# Patient Record
Sex: Female | Born: 1965 | Race: White | Hispanic: No | Smoking: Former smoker
Health system: Western US, Community
[De-identification: ages and names within clinical notes are randomized; demographics above are authoritative.]

## PROBLEM LIST (undated history)

## (undated) DIAGNOSIS — R490 Dysphonia: Secondary | ICD-10-CM

## (undated) HISTORY — PX: WISDOM TOOTH EXTRACTION: SHX21

---

## 2007-04-08 ENCOUNTER — Emergency Department: Payer: Self-pay | Admitting: Internal Medicine

## 2007-09-02 ENCOUNTER — Ambulatory Visit: Payer: Self-pay | Admitting: Gastroenterology

## 2009-02-13 LAB — CBC WITH AUTO DIFFERENTIAL
Basophils %: 1 % (ref 0–2)
Basophils, Absolute: 0.1 10*3/uL (ref 0–0.2)
Eosinophils %: 1 % (ref 0–7)
Eosinophils, Absolute: 0.1 10*3/uL (ref 0–0.7)
HCT: 43.3 % (ref 37.0–48.0)
Hgb: 14.8 gm/dL (ref 12.0–16.0)
Lymphocytes %: 32 % (ref 25–45)
Lymphocytes, Absolute: 3 10*3/uL (ref 1.1–4.3)
MCH: 31.5 pg (ref 27–34)
MCHC: 34.1 gm/dL (ref 32–36)
MCV: 92.5 fL (ref 81–99)
MPV: 7.1 fL — ABNORMAL LOW (ref 7.4–10.4)
Monocytes %: 6 % (ref 0–12)
Monocytes, Absolute: 0.6 10*3/uL (ref 0–1.2)
Neutrophils, Absolute: 5.7 10*3/uL (ref 1.6–7.3)
Platelet Count: 356 10*3/uL (ref 150–400)
RBC: 4.68 10*6/uL (ref 4.20–5.40)
RDW: 13.3 % (ref 11.5–14.5)
Segs (Neutrophils)%: 60 % (ref 35–70)
WBC: 9.4 10*3/uL (ref 4.8–10.8)

## 2009-02-13 LAB — COMPREHENSIVE METABOLIC PANEL
ALT - Alanine Amino transferase: 15 IU/L (ref 5–40)
AST - Aspartate Aminotransferase: 18 IU/L (ref 10–42)
Albumin/Globulin Ratio: 1.4 (ref >0.9)
Albumin: 4 gm/dL (ref 3.5–5.0)
Alkaline Phosphatase: 81 IU/L (ref 37–107)
Anion Gap: 8 mmol/L (ref 3–11)
BUN: 7 mg/dL — ABNORMAL LOW (ref 8–21)
Bilirubin, Total: 0.7 mg/dL (ref 0.2–1.2)
CO2 - Carbon Dioxide: 28 mmol/L (ref 22–31)
Calcium: 9.1 mg/dL (ref 8.5–10.5)
Chloride: 104 mmol/L (ref 98–111)
Creatinine, Serum: 0.9 mg/dL (ref 0.6–1.3)
GFR Estimate: >60 mL/min/{1.73_m2} (ref >60)
Globulin: 2.9 gm/dL (ref 2.2–3.7)
Glucose: 94 mg/dL (ref 80–99)
Potassium: 3.5 mmol/L (ref 3.5–5.1)
Protein, Total: 6.9 gm/dL (ref 6.1–7.9)
Sodium: 140 mmol/L (ref 133–147)

## 2009-02-13 LAB — URINALYSIS, ROUTINE
Bilirubin, urine: NEGATIVE
Glucose, UA: NEGATIVE mg/dl
Leukocyte Esterase, urine: NEGATIVE
Nitrite, urine: NEGATIVE
Protein, urine: NEGATIVE
Specific Gravity, urine: >1.03 (ref 1.002–1.035)
Urobilinogen, urine: NEGATIVE EU/dL
pH, UA: 6 (ref 5.0–9.0)

## 2009-02-13 LAB — UA, MICROSCOPIC ONLY
RBC, UA: 2 /HPF — AB
Squamous Epithelial, UA: 2 /HPF (ref 0–5)
WBC, UA: 0 /HPF

## 2009-02-13 LAB — LIPASE: Lipase: 11 U/L — ABNORMAL LOW (ref 15–50)

## 2009-02-18 LAB — COMPREHENSIVE METABOLIC PANEL
ALT - Alanine Amino transferase: 12 IU/L (ref 5–40)
AST - Aspartate Aminotransferase: 18 IU/L (ref 10–42)
Albumin/Globulin Ratio: 1.3 (ref >0.9)
Albumin: 3.2 gm/dL — ABNORMAL LOW (ref 3.5–5.0)
Alkaline Phosphatase: 59 IU/L (ref 37–107)
Anion Gap: 7 mmol/L (ref 3–11)
BUN: 4 mg/dL — CL (ref 8–21)
Bilirubin, Total: 0.5 mg/dL (ref 0.2–1.2)
CO2 - Carbon Dioxide: 25 mmol/L (ref 22–31)
Calcium: 8 mg/dL — ABNORMAL LOW (ref 8.5–10.5)
Chloride: 107 mmol/L (ref 98–111)
Creatinine, Serum: 0.7 mg/dL (ref 0.6–1.3)
GFR Estimate: >60 mL/min/{1.73_m2} (ref >60)
Globulin: 2.4 gm/dL (ref 2.2–3.7)
Glucose: 104 mg/dL — ABNORMAL HIGH (ref 80–99)
Potassium: 3.6 mmol/L (ref 3.5–5.1)
Protein, Total: 5.6 gm/dL — ABNORMAL LOW (ref 6.1–7.9)
Sodium: 139 mmol/L (ref 133–147)

## 2009-02-18 LAB — CBC WITH AUTO DIFFERENTIAL
Basophils %: 1 % (ref 0–2)
Basophils, Absolute: 0 10*3/uL (ref 0–0.2)
Eosinophils %: 4 % (ref 0–7)
Eosinophils, Absolute: 0.3 10*3/uL (ref 0–0.7)
HCT: 35.1 % — ABNORMAL LOW (ref 37.0–48.0)
Hgb: 12 gm/dL (ref 12.0–16.0)
Lymphocytes %: 42 % (ref 25–45)
Lymphocytes, Absolute: 3.4 10*3/uL (ref 1.1–4.3)
MCH: 31.9 pg (ref 27–34)
MCHC: 34.2 gm/dL (ref 32–36)
MCV: 93.3 fL (ref 81–99)
MPV: 6.9 fL — ABNORMAL LOW (ref 7.4–10.4)
Monocytes %: 8 % (ref 0–12)
Monocytes, Absolute: 0.6 10*3/uL (ref 0–1.2)
Neutrophils, Absolute: 3.7 10*3/uL (ref 1.6–7.3)
Platelet Count: 291 10*3/uL (ref 150–400)
RBC: 3.76 10*6/uL — ABNORMAL LOW (ref 4.20–5.40)
RDW: 13.9 % (ref 11.5–14.5)
Segs (Neutrophils)%: 45 % (ref 35–70)
WBC: 8 10*3/uL (ref 4.8–10.8)

## 2009-05-02 LAB — COMPREHENSIVE METABOLIC PANEL
ALT - Alanine Amino transferase: 13 IU/L (ref 5–40)
AST - Aspartate Aminotransferase: 17 IU/L (ref 10–42)
Albumin/Globulin Ratio: 1.4 (ref >0.9)
Albumin: 3.8 gm/dL (ref 3.5–5.0)
Alkaline Phosphatase: 77 IU/L (ref 37–107)
Anion Gap: 7 mmol/L (ref 3–11)
BUN: 7 mg/dL — ABNORMAL LOW (ref 8–21)
Bilirubin, Total: 0.5 mg/dL (ref 0.2–1.2)
CO2 - Carbon Dioxide: 26 mmol/L (ref 22–31)
Calcium: 8.7 mg/dL (ref 8.5–10.5)
Chloride: 110 mmol/L (ref 98–111)
Creatinine, Serum: 0.82 mg/dL (ref 0.44–1.03)
GFR Estimate: >60 mL/min/{1.73_m2} (ref >60)
Globulin: 2.7 gm/dL (ref 2.2–3.7)
Glucose: 96 mg/dL (ref 80–99)
Potassium: 3.6 mmol/L (ref 3.5–5.1)
Protein, Total: 6.5 gm/dL (ref 6.1–7.9)
Sodium: 143 mmol/L (ref 133–147)

## 2009-05-02 LAB — CBC WITH AUTO DIFFERENTIAL
Basophils %: 0 % (ref 0–2)
Basophils, Absolute: 0 10*3/uL (ref 0–0.2)
Eosinophils %: 4 % (ref 0–7)
Eosinophils, Absolute: 0.4 10*3/uL (ref 0–0.7)
HCT: 42.9 % (ref 37.0–48.0)
Hgb: 14.7 gm/dL (ref 12.0–16.0)
Lymphocytes %: 31 % (ref 25–45)
Lymphocytes, Absolute: 2.8 10*3/uL (ref 1.1–4.3)
MCH: 31.5 pg (ref 27–34)
MCHC: 34.2 gm/dL (ref 32–36)
MCV: 92 fL (ref 81–99)
MPV: 6.6 fL — ABNORMAL LOW (ref 7.4–10.4)
Monocytes %: 6 % (ref 0–12)
Monocytes, Absolute: 0.5 10*3/uL (ref 0–1.2)
Neutrophils, Absolute: 5.4 10*3/uL (ref 1.6–7.3)
Platelet Count: 319 10*3/uL (ref 150–400)
RBC: 4.66 10*6/uL (ref 4.20–5.40)
RDW: 13.3 % (ref 11.5–14.5)
Segs (Neutrophils)%: 59 % (ref 35–70)
WBC: 9.1 10*3/uL (ref 4.8–10.8)

## 2009-05-02 LAB — PT & PTT
APTT: 34 s (ref 23–34)
INR: 1.1 (ref 0.9–1.2)
Protime: 11.2 s (ref 10.0–13.0)

## 2009-05-02 LAB — LIPASE: Lipase: 15 U/L (ref 15–50)

## 2009-05-03 LAB — CBC WITH AUTO DIFFERENTIAL
Basophils %: 1 % (ref 0–2)
Basophils, Absolute: 0.1 10*3/uL (ref 0–0.2)
Eosinophils %: 3 % (ref 0–7)
Eosinophils, Absolute: 0.2 10*3/uL (ref 0–0.7)
HCT: 39.9 % (ref 37.0–48.0)
Hgb: 13.8 gm/dL (ref 12.0–16.0)
Lymphocytes %: 40 % (ref 25–45)
Lymphocytes, Absolute: 3.3 10*3/uL (ref 1.1–4.3)
MCH: 31.7 pg (ref 27–34)
MCHC: 34.5 gm/dL (ref 32–36)
MCV: 91.8 fL (ref 81–99)
MPV: 7.3 fL — ABNORMAL LOW (ref 7.4–10.4)
Monocytes %: 6 % (ref 0–12)
Monocytes, Absolute: 0.5 10*3/uL (ref 0–1.2)
Neutrophils, Absolute: 4.2 10*3/uL (ref 1.6–7.3)
Platelet Count: 306 10*3/uL (ref 150–400)
RBC: 4.35 10*6/uL (ref 4.20–5.40)
RDW: 13.1 % (ref 11.5–14.5)
Segs (Neutrophils)%: 50 % (ref 35–70)
WBC: 8.3 10*3/uL (ref 4.8–10.8)

## 2009-05-03 LAB — URINALYSIS, ROUTINE
Bilirubin, urine: NEGATIVE
Glucose, UA: NEGATIVE mg/dl
Ketones, urine: NEGATIVE
Leukocyte Esterase, urine: NEGATIVE
Nitrite, urine: NEGATIVE
Protein, urine: NEGATIVE
Specific Gravity, urine: 1.01 (ref 1.002–1.035)
Urobilinogen, urine: NEGATIVE EU/dL
pH, UA: 6.5 (ref 5.0–9.0)

## 2009-05-03 LAB — UA, MICROSCOPIC ONLY
Hyaline Casts, UA: 0 /LPF
RBC, UA: 0 /HPF — AB
Squamous Epithelial, UA: 0 /HPF (ref 0–5)
WBC, UA: 0 /HPF

## 2009-05-03 LAB — URINE CULTURE IF INDICATED ORD W/URINALYSIS

## 2009-05-21 LAB — COMPREHENSIVE METABOLIC PANEL
ALT - Alanine Amino transferase: 16 IU/L (ref 5–40)
AST - Aspartate Aminotransferase: 20 IU/L (ref 10–42)
Albumin/Globulin Ratio: 1.2 (ref >0.9)
Albumin: 3.8 gm/dL (ref 3.5–5.0)
Alkaline Phosphatase: 85 IU/L (ref 37–107)
Anion Gap: 9 mmol/L (ref 3–11)
BUN: 8 mg/dL (ref 8–21)
Bilirubin, Total: 0.4 mg/dL (ref 0.2–1.2)
CO2 - Carbon Dioxide: 29 mmol/L (ref 22–31)
Calcium: 9.2 mg/dL (ref 8.5–10.5)
Chloride: 101 mmol/L (ref 98–111)
Creatinine, Serum: 0.79 mg/dL (ref 0.44–1.03)
GFR Estimate: >60 mL/min/{1.73_m2} (ref >60)
Globulin: 3.3 gm/dL (ref 2.2–3.7)
Glucose: 98 mg/dL (ref 80–99)
Potassium: 3.6 mmol/L (ref 3.5–5.1)
Protein, Total: 7.1 gm/dL (ref 6.1–7.9)
Sodium: 139 mmol/L (ref 133–147)

## 2009-05-21 LAB — URINALYSIS, ROUTINE
Bilirubin, urine: NEGATIVE
Glucose, UA: NEGATIVE mg/dl
Ketones, urine: NEGATIVE
Leukocyte Esterase, urine: NEGATIVE
Nitrite, urine: NEGATIVE
Protein, urine: NEGATIVE
Specific Gravity, urine: 1.02 (ref 1.002–1.035)
Urobilinogen, urine: NEGATIVE EU/dL
pH, UA: 6.5 (ref 5.0–9.0)

## 2009-05-21 LAB — CBC WITH AUTO DIFFERENTIAL
Basophils %: 0 % (ref 0–2)
Basophils, Absolute: 0 10*3/uL (ref 0–0.2)
Eosinophils %: 2 % (ref 0–7)
Eosinophils, Absolute: 0.2 10*3/uL (ref 0–0.7)
HCT: 42.8 % (ref 37.0–48.0)
Hgb: 15 gm/dL (ref 12.0–16.0)
Lymphocytes %: 25 % (ref 25–45)
Lymphocytes, Absolute: 2.8 10*3/uL (ref 1.1–4.3)
MCH: 32.2 pg (ref 27–34)
MCHC: 35 gm/dL (ref 32–36)
MCV: 91.8 fL (ref 81–99)
MPV: 7.1 fL — ABNORMAL LOW (ref 7.4–10.4)
Monocytes %: 6 % (ref 0–12)
Monocytes, Absolute: 0.7 10*3/uL (ref 0–1.2)
Neutrophils, Absolute: 7.6 10*3/uL — ABNORMAL HIGH (ref 1.6–7.3)
Platelet Count: 335 10*3/uL (ref 150–400)
RBC: 4.66 10*6/uL (ref 4.20–5.40)
RDW: 13.2 % (ref 11.5–14.5)
Segs (Neutrophils)%: 67 % (ref 35–70)
WBC: 11.4 10*3/uL — ABNORMAL HIGH (ref 4.8–10.8)

## 2009-05-21 LAB — UA, MICROSCOPIC ONLY
Hyaline Casts, UA: 0 /LPF
RBC, UA: 5 /HPF — AB
Squamous Epithelial, UA: 5 /HPF (ref 0–5)
WBC, UA: 0 /HPF

## 2009-05-21 LAB — LIPASE: Lipase: 12 U/L — ABNORMAL LOW (ref 15–50)

## 2009-05-21 LAB — URINE CULTURE IF INDICATED ORD W/URINALYSIS

## 2009-05-21 LAB — TSH: TSH - Thyroid Stimulating Hormone: 0.92 microIU/mL (ref 0.40–5.80)

## 2009-05-21 LAB — AMYLASE (SLM): Amylase: 23 U/L — ABNORMAL LOW (ref 25–125)

## 2009-06-29 ENCOUNTER — Emergency Department: Payer: Self-pay | Admitting: Emergency Medicine

## 2010-06-03 ENCOUNTER — Emergency Department: Payer: Self-pay | Admitting: Emergency Medicine

## 2011-06-28 ENCOUNTER — Emergency Department: Payer: Self-pay | Admitting: Unknown Physician Specialty

## 2011-09-11 ENCOUNTER — Emergency Department: Payer: Self-pay | Admitting: Emergency Medicine

## 2011-11-20 ENCOUNTER — Emergency Department: Payer: Self-pay | Admitting: Unknown Physician Specialty

## 2011-11-20 ENCOUNTER — Ambulatory Visit: Payer: Self-pay | Admitting: Internal Medicine

## 2011-11-20 LAB — COMPREHENSIVE METABOLIC PANEL
Alkaline Phosphatase: 47 U/L — ABNORMAL LOW (ref 50–136)
BUN: 7 mg/dL (ref 7–18)
Calcium, Total: 8.9 mg/dL (ref 8.5–10.1)
Chloride: 104 mmol/L (ref 98–107)
Co2: 26 mmol/L (ref 21–32)
EGFR (African American): >60
EGFR (Non-African Amer.): >60
Osmolality: 274 (ref 275–301)
Potassium: 4.8 mmol/L (ref 3.5–5.1)
SGPT (ALT): 18 U/L
Sodium: 138 mmol/L (ref 136–145)

## 2011-11-20 LAB — DRUG SCREEN, URINE
Amphetamines, Ur Screen: NEGATIVE (ref ?–1000)
Barbiturates, Ur Screen: NEGATIVE (ref ?–200)
Benzodiazepine, Ur Scrn: NEGATIVE (ref ?–200)
Cannabinoid 50 Ng, Ur ~~LOC~~: NEGATIVE (ref ?–50)
MDMA (Ecstasy)Ur Screen: NEGATIVE (ref ?–500)
Methadone, Ur Screen: NEGATIVE (ref ?–300)
Opiate, Ur Screen: NEGATIVE (ref ?–300)
Phencyclidine (PCP) Ur S: NEGATIVE (ref ?–25)

## 2011-11-20 LAB — URINALYSIS, COMPLETE
Bacteria: NONE SEEN
Bilirubin,UR: NEGATIVE
Blood: NEGATIVE
Glucose,UR: NEGATIVE mg/dL (ref 0–75)
Leukocyte Esterase: NEGATIVE
Protein: NEGATIVE
RBC,UR: 1 /HPF (ref 0–5)
Specific Gravity: 1.006 (ref 1.003–1.030)
Squamous Epithelial: 3
WBC UR: 1 /HPF (ref 0–5)

## 2011-11-20 LAB — CBC
HCT: 40.3 % (ref 35.0–47.0)
HGB: 13.8 g/dL (ref 12.0–16.0)
MCHC: 34.2 g/dL (ref 32.0–36.0)
MCV: 84 fL (ref 80–100)
RDW: 15.3 % — ABNORMAL HIGH (ref 11.5–14.5)
WBC: 8.9 10*3/uL (ref 3.6–11.0)

## 2011-11-20 LAB — TSH: Thyroid Stimulating Horm: 0.395 u[IU]/mL — ABNORMAL LOW

## 2011-11-20 LAB — MAGNESIUM: Magnesium: 1.8 mg/dL

## 2012-03-02 ENCOUNTER — Emergency Department: Admit: 2012-03-02 | Payer: PRIVATE HEALTH INSURANCE

## 2012-03-02 ENCOUNTER — Inpatient Hospital Stay
Admit: 2012-03-02 | Discharge: 2012-03-02 | Disposition: A | Discharge status: Home / Self Care | Payer: PRIVATE HEALTH INSURANCE | Attending: MD

## 2012-03-02 DIAGNOSIS — R1110 Vomiting, unspecified: Secondary | ICD-10-CM

## 2012-03-02 LAB — COMPREHENSIVE METABOLIC PANEL
ALT - Alanine Amino transferase: 14 IU/L (ref 10–45)
AST - Aspartate Aminotransferase: 23 IU/L (ref 10–42)
Albumin/Globulin Ratio: 1.3 (ref >0.9)
Albumin: 3.7 gm/dL (ref 3.5–5.0)
Alkaline Phosphatase: 71 IU/L (ref 37–107)
Anion Gap: 6 mmol/L (ref 3–11)
BUN: 11 mg/dL (ref 8–21)
Bilirubin, Total: 0.4 mg/dL (ref 0.2–1.4)
CO2 - Carbon Dioxide: 26 mmol/L (ref 22–31)
Calcium: 9 mg/dL (ref 8.6–10.0)
Chloride: 104 mmol/L (ref 98–111)
Creatinine, Serum: 0.76 mg/dL (ref 0.44–1.03)
GFR Estimate: >60 mL/min/{1.73_m2} (ref >60)
Globulin: 2.8 gm/dL (ref 2.2–3.7)
Glucose: 100 mg/dL — ABNORMAL HIGH (ref 80–99)
Potassium: 4.3 mmol/L (ref 3.5–5.1)
Protein, Total: 6.5 gm/dL (ref 6.1–7.9)
Sodium: 136 mmol/L (ref 135–143)

## 2012-03-02 LAB — CBC WITH AUTO DIFFERENTIAL
Basophils %: 1 % (ref 0–2)
Basophils, Absolute: 0.1 10*3/uL (ref 0–0.2)
Eosinophils %: 4 % (ref 0–7)
Eosinophils, Absolute: 0.5 10*3/uL (ref 0–0.7)
HCT: 42.7 % (ref 37.0–48.0)
Hgb: 13.8 gm/dL (ref 12.0–16.0)
Lymphocytes %: 27 % (ref 25–45)
Lymphocytes, Absolute: 3.2 10*3/uL (ref 1.1–4.3)
MCH: 31.1 pg (ref 27–34)
MCHC: 32.2 gm/dL (ref 32–36)
MCV: 96.6 fL (ref 81–99)
MPV: 7.6 fL (ref 7.4–10.4)
Monocytes %: 7 % (ref 0–12)
Monocytes, Absolute: 0.8 10*3/uL (ref 0–1.2)
Neutrophils, Absolute: 7.1 10*3/uL (ref 1.6–7.3)
Platelet Count: 366 10*3/uL (ref 150–400)
RBC: 4.42 10*6/uL (ref 4.20–5.40)
RDW: 12.9 % (ref 11.5–14.5)
Segs (Neutrophils)%: 61 % (ref 35–70)
WBC: 11.6 10*3/uL — ABNORMAL HIGH (ref 4.8–10.8)

## 2012-03-02 LAB — URINALYSIS, ROUTINE
Bacteria, UA: 2 /HPF (ref 0–92)
Bilirubin, urine: NEGATIVE
Glucose, UA: NEGATIVE mg/dl
Hyaline Casts, UA: 0 /LPF (ref 0–5)
Ketones, urine: NEGATIVE
Leukocyte Esterase, urine: NEGATIVE
Nitrite, urine: NEGATIVE
Protein, urine: NEGATIVE
RBC, UA: 7 /HPF — ABNORMAL HIGH (ref 0–5)
Specific Gravity, urine: 1.013 (ref 1.003–1.030)
Squamous Epithelial, UA: 0 /HPF (ref 0–5)
Urobilinogen, urine: NEGATIVE EU/dL
WBC, UA: 1 /HPF (ref 0–3)
pH, UA: 7 (ref 5.0–8.5)

## 2012-03-02 LAB — URINE CULTURE: Culture Result: 10000

## 2012-03-02 LAB — LIPASE: Lipase: 19 U/L (ref 15–50)

## 2012-03-02 LAB — BETA-HCG, URINE, QUALITATIVE
Specific Gravity, hCG: 1.013 (ref 1.002–1.035)
hCG, Qualitative: NEGATIVE

## 2012-03-02 MED ORDER — sodium chloride 0.9% (NS) syringe 5 mL
INTRAMUSCULAR | Status: DC | PRN
Start: 2012-03-02 — End: 2012-03-02

## 2012-03-02 MED ORDER — HYDROmorphone (DILAUDID) 1 mg/mL syringe
1 | INTRAMUSCULAR | Status: AC
Start: 2012-03-02 — End: ?

## 2012-03-02 MED ORDER — sodium chloride 0.9 % bolus 1,000 mL
Freq: Once | INTRAVENOUS | Status: AC
Start: 2012-03-02 — End: 2012-03-02
  Administered 2012-03-02 (×2): via INTRAVENOUS

## 2012-03-02 MED ORDER — sodium chloride 0.9% (NS) syringe 5 mL
Freq: Three times a day (TID) | INTRAMUSCULAR | Status: DC
Start: 2012-03-02 — End: 2012-03-02

## 2012-03-02 MED ORDER — ondansetron (ZOFRAN) 4 MG tablet
4 | ORAL_TABLET | Freq: Four times a day (QID) | ORAL | Status: AC
Start: 2012-03-02 — End: 2012-03-09

## 2012-03-02 MED ORDER — HYDROmorphone (DILAUDID) 1 mg/mL syringe 1 mg
1 | Freq: Once | INTRAMUSCULAR | Status: AC
Start: 2012-03-02 — End: 2012-03-02
  Administered 2012-03-02: 18:00:00 1 mg via INTRAVENOUS

## 2012-03-02 MED ORDER — ondansetron (ZOFRAN) 4 mg/2 mL injection
4 | INTRAMUSCULAR | Status: AC
Start: 2012-03-02 — End: ?

## 2012-03-02 MED ORDER — HYDROmorphone (DILAUDID) 1 mg/mL syringe 0.5 mg
1 | Freq: Once | INTRAMUSCULAR | Status: AC
Start: 2012-03-02 — End: 2012-03-02
  Administered 2012-03-02: 20:00:00 1 mg via INTRAVENOUS

## 2012-03-02 MED ORDER — pantoprazole (PROTONIX) injection 40 mg
40 | Freq: Once | INTRAVENOUS | Status: AC
Start: 2012-03-02 — End: 2012-03-02
  Administered 2012-03-02: 17:00:00 40 mg via INTRAVENOUS

## 2012-03-02 MED ORDER — sodium chloride 0.9% with pantoprazole (PROTONIX) Omnicell Med
40 | INTRAVENOUS | Status: AC
Start: 2012-03-02 — End: ?

## 2012-03-02 MED ORDER — ondansetron (ZOFRAN) 4 mg/2 mL injection 4 mg
4 | Freq: Once | INTRAMUSCULAR | Status: AC
Start: 2012-03-02 — End: 2012-03-02
  Administered 2012-03-02: 18:00:00 4 mg via INTRAVENOUS

## 2012-03-02 MED FILL — PROTONIX 40 MG INTRAVENOUS SOLUTION: 40 mg | INTRAVENOUS | Qty: 40

## 2012-03-02 MED FILL — HYDROMORPHONE (PF) 1 MG/ML INJECTION SYRINGE: 1 mg/mL | INTRAMUSCULAR | Qty: 1

## 2012-03-02 MED FILL — ONDANSETRON HCL (PF) 4 MG/2 ML INJECTION SOLUTION: 4 | INTRAMUSCULAR | Qty: 2

## 2012-04-10 ENCOUNTER — Inpatient Hospital Stay
Admit: 2012-04-10 | Discharge: 2012-04-10 | Disposition: A | Discharge status: Home / Self Care | Payer: PRIVATE HEALTH INSURANCE | Attending: MD

## 2012-04-10 ENCOUNTER — Emergency Department: Admit: 2012-04-10 | Payer: PRIVATE HEALTH INSURANCE

## 2012-04-10 DIAGNOSIS — R51 Headache: Secondary | ICD-10-CM

## 2012-04-10 LAB — CBC WITH AUTO DIFFERENTIAL
Basophils %: 1 % (ref 0–2)
Basophils, Absolute: 0.1 10*3/uL (ref 0–0.2)
Eosinophils %: 3 % (ref 0–7)
Eosinophils, Absolute: 0.3 10*3/uL (ref 0–0.7)
HCT: 37.8 % (ref 37.0–48.0)
Hgb: 12.3 gm/dL (ref 12.0–16.0)
Lymphocytes %: 28 % (ref 25–45)
Lymphocytes, Absolute: 2.6 10*3/uL (ref 1.1–4.3)
MCH: 31.1 pg (ref 27–34)
MCHC: 32.6 gm/dL (ref 32–36)
MCV: 95.5 fL (ref 81–99)
MPV: 7.8 fL (ref 7.4–10.4)
Monocytes %: 5 % (ref 0–12)
Monocytes, Absolute: 0.5 10*3/uL (ref 0–1.2)
Neutrophils, Absolute: 5.8 10*3/uL (ref 1.6–7.3)
Platelet Count: 337 10*3/uL (ref 150–400)
RBC: 3.96 10*6/uL — ABNORMAL LOW (ref 4.20–5.40)
RDW: 13.1 % (ref 11.5–14.5)
Segs (Neutrophils)%: 63 % (ref 35–70)
WBC: 9.3 10*3/uL (ref 4.8–10.8)

## 2012-04-10 LAB — WOUND CULTURE, SUPERFICIAL
Culture Result: NO GROWTH
Gram Stain Result: NONE SEEN
Gram Stain Result: NONE SEEN

## 2012-04-10 LAB — COMPREHENSIVE METABOLIC PANEL
ALT - Alanine Amino transferase: 12 IU/L (ref 10–45)
AST - Aspartate Aminotransferase: 21 IU/L (ref 10–42)
Albumin/Globulin Ratio: 1.7 (ref >0.9)
Albumin: 3.5 gm/dL (ref 3.5–5.0)
Alkaline Phosphatase: 71 IU/L (ref 37–107)
Anion Gap: 5 mmol/L (ref 3–11)
BUN: 9 mg/dL (ref 8–21)
Bilirubin, Total: 0.4 mg/dL (ref 0.2–1.4)
CO2 - Carbon Dioxide: 25 mmol/L (ref 22–31)
Calcium: 8.5 mg/dL — ABNORMAL LOW (ref 8.6–10.0)
Chloride: 109 mmol/L (ref 98–111)
Creatinine, Serum: 0.78 mg/dL (ref 0.44–1.03)
GFR Estimate: >60 mL/min/{1.73_m2} (ref >60)
Globulin: 2.1 gm/dL — ABNORMAL LOW (ref 2.2–3.7)
Glucose: 108 mg/dL — ABNORMAL HIGH (ref 80–99)
Potassium: 4 mmol/L (ref 3.5–5.1)
Protein, Total: 5.6 gm/dL — ABNORMAL LOW (ref 6.1–7.9)
Sodium: 139 mmol/L (ref 135–143)

## 2012-04-10 LAB — HEPATITIS C ANTIBODY W/ REFLEX HCV RNA PCR (ASA)
Hepatitis C Antibody: NEGATIVE
Hepatitis C Signal/Cutoff: 0.08 RATIO (ref <0.80)

## 2012-04-10 LAB — CELL COUNT WITH DIFFERENTIAL, CSF
RBC, CSF: 3 /cu.mm
RBC, CSF: 92 /cu.mm
Tube Number, CSF: 1
Tube Number, CSF: 3
Volume, CSF: 1 mL
Volume, CSF: 1 mL
WBC, CSF: 0 /cu.mm (ref 0–10)
WBC, CSF: 0 /cu.mm (ref 0–10)

## 2012-04-10 LAB — PT & PTT
APTT: 31 s (ref 23–34)
INR: 0.9 (ref 0.9–1.2)
Protime: 9.5 s — ABNORMAL LOW (ref 10.0–13.0)

## 2012-04-10 LAB — RAPID HIV SCREEN - EXPOSURE (TRMC LAB ONLY): HIV-1 Ab Screen (Rapid)    (Exposure Related): NEGATIVE

## 2012-04-10 LAB — GRAM STAIN

## 2012-04-10 LAB — HEPATITIS B SURFACE ANTIGEN: Hepatitis B Surface Antigen: NEGATIVE

## 2012-04-10 LAB — PROTEIN, CSF: Protein, Total (CSF): 30 mg/dL (ref 15–60)

## 2012-04-10 LAB — LITHIUM LEVEL: Lithium Level: 0.4 mmol/L — ABNORMAL LOW (ref 0.5–1.2)

## 2012-04-10 LAB — GLUCOSE, CSF: Glucose, CSF: 66 mg/dL (ref 50–80)

## 2012-04-10 MED ORDER — metoclopramide HCl (REGLAN) injection 10 mg
5 | Freq: Once | INTRAMUSCULAR | Status: AC
Start: 2012-04-10 — End: 2012-04-10
  Administered 2012-04-10: 16:00:00 5 mg via INTRAVENOUS

## 2012-04-10 MED ORDER — HYDROmorphone (DILAUDID) 1 mg/mL syringe 0.5 mg
1 | Freq: Once | INTRAMUSCULAR | Status: AC
Start: 2012-04-10 — End: 2012-04-10
  Administered 2012-04-10: 18:00:00 1 mg via INTRAVENOUS

## 2012-04-10 MED ORDER — diphenhydrAMINE (BENADRYL) injection 25 mg
50 | Freq: Once | INTRAMUSCULAR | Status: AC
Start: 2012-04-10 — End: 2012-04-10
  Administered 2012-04-10: 16:00:00 50 mg via INTRAVENOUS

## 2012-04-10 MED ORDER — HYDROmorphone (DILAUDID) 1 mg/mL syringe
1 | INTRAMUSCULAR | Status: AC
Start: 2012-04-10 — End: ?

## 2012-04-10 MED ORDER — lidocaine (XYLOCAINE) 10 mg/mL (1 %) injection
10 | INTRAMUSCULAR | Status: AC
Start: 2012-04-10 — End: ?

## 2012-04-10 MED ORDER — sodium chloride 0.9 % bolus 1,000 mL
Freq: Once | INTRAVENOUS | Status: AC
Start: 2012-04-10 — End: 2012-04-10
  Administered 2012-04-10 (×2): via INTRAVENOUS

## 2012-04-10 MED ORDER — metoclopramide HCl (REGLAN) 5 mg/mL injection
5 | INTRAMUSCULAR | Status: AC
Start: 2012-04-10 — End: ?

## 2012-04-10 MED ORDER — diphenhydrAMINE (BENADRYL) 50 mg/mL injection
50 | INTRAMUSCULAR | Status: AC
Start: 2012-04-10 — End: ?

## 2012-04-10 MED ORDER — HYDROmorphone (DILAUDID) 1 mg/mL syringe 0.5 mg
1 | Freq: Once | INTRAMUSCULAR | Status: AC
Start: 2012-04-10 — End: 2012-04-10
  Administered 2012-04-10: 17:00:00 1 mg via INTRAVENOUS

## 2012-04-10 MED FILL — DIPHENHYDRAMINE 50 MG/ML INJECTION SOLUTION: 50 mg/mL | INTRAMUSCULAR | Qty: 1 | Fill #0

## 2012-04-10 MED FILL — HYDROMORPHONE (PF) 1 MG/ML INJECTION SYRINGE: 1 mg/mL | INTRAMUSCULAR | Qty: 1

## 2012-04-10 MED FILL — METOCLOPRAMIDE 5 MG/ML INJECTION SOLUTION: 5 mg/mL | INTRAMUSCULAR | Qty: 2

## 2012-04-10 MED FILL — XYLOCAINE-MPF 10 MG/ML (1 %) INJECTION SOLUTION: 10 mg/mL (1 %) | INTRAMUSCULAR | Qty: 30

## 2012-04-13 ENCOUNTER — Inpatient Hospital Stay: Payer: PRIVATE HEALTH INSURANCE

## 2012-04-13 DIAGNOSIS — R112 Nausea with vomiting, unspecified: Secondary | ICD-10-CM

## 2012-04-13 LAB — TISSUE TRANSGLUTAMINASE, IGG AND IGA
Tissue Transglutaminase, IgA: <1.2 U/mL
Tissue Transglutaminase, IgG: 2.2 U/mL

## 2012-08-16 IMAGING — CT CT HEAD WITHOUT CONTRAST
2 series · 16 of 30 positions shown, 20 images · non-contrast
Comparison: none

REASON FOR EXAM: HA seizure
COMMENTS:

PROCEDURE:     CT  - CT HEAD WITHOUT CONTRAST  - November 20, 2011 [DATE]
RESULT:     Comparison:  None
TECHNIQUE: Multiple axial images from the foramen magnum to the vertex were
obtained without IV contrast.

[Series 2: without · axial · non-contrast · 0.41mm/px · z∈[+470,+595]mm · 13 of 31 slices shown, 17 images]
[im 3/31  brain]
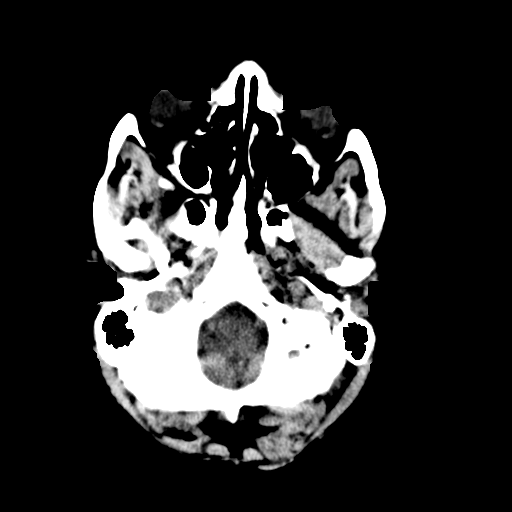
[im 3/31  bone]
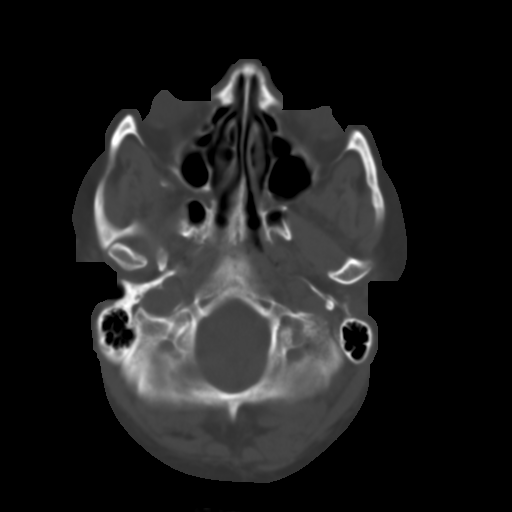
[im 5/31  brain]
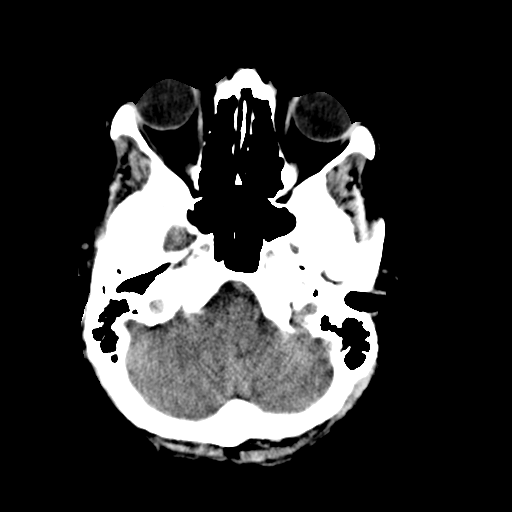
[im 7/31  brain]
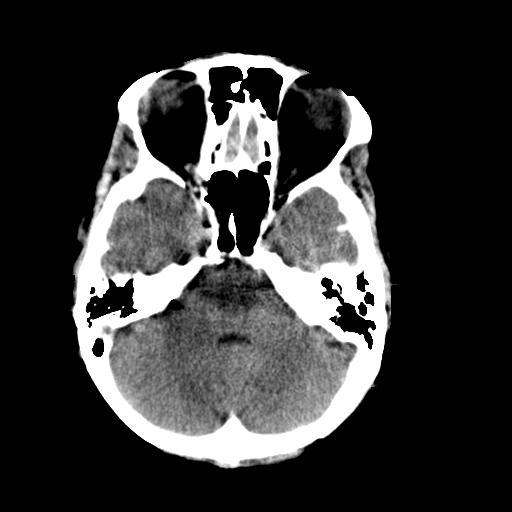
[im 9/31  brain]
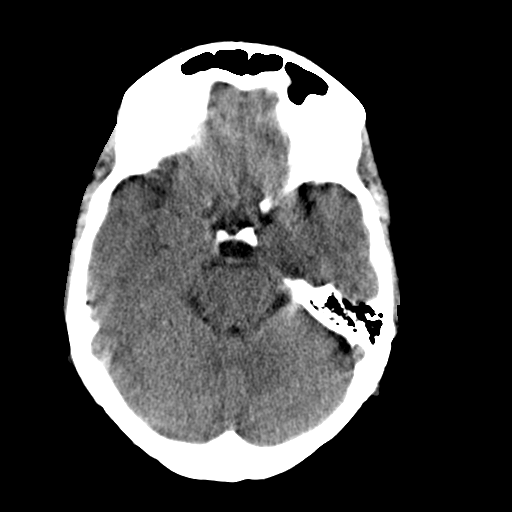
[im 11/31  brain]
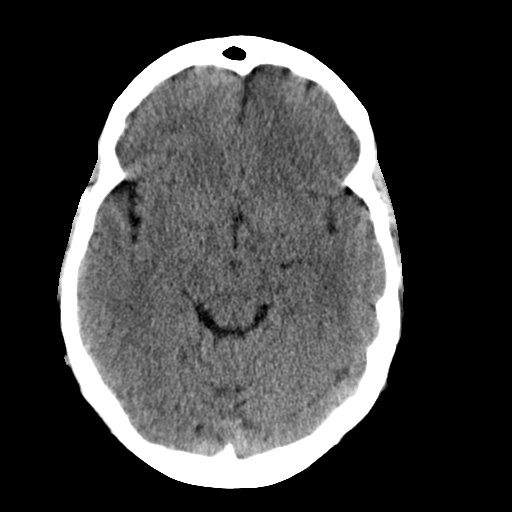
[im 11/31  bone]
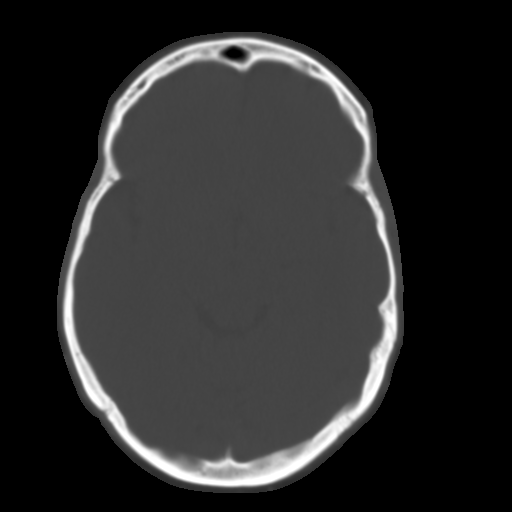
[im 13/31  brain]
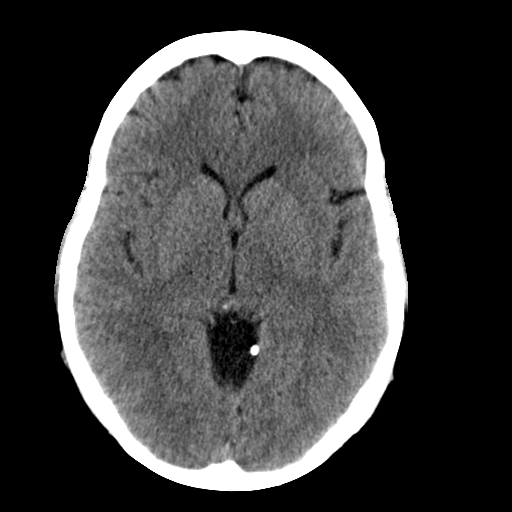
[im 16/31  brain]
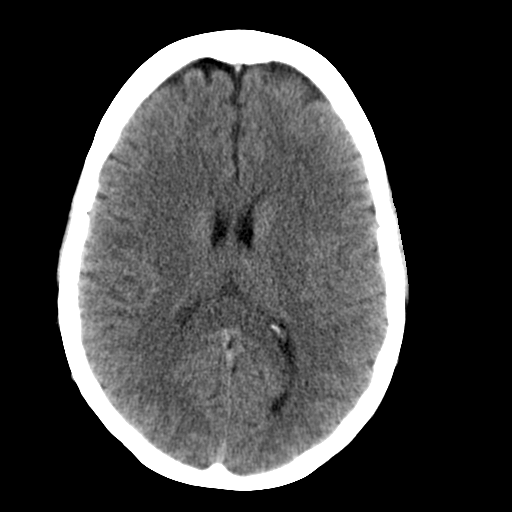
[im 18/31  brain]
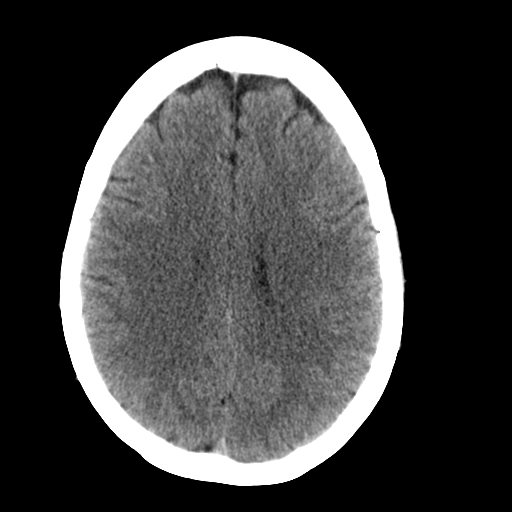
[im 20/31  brain]
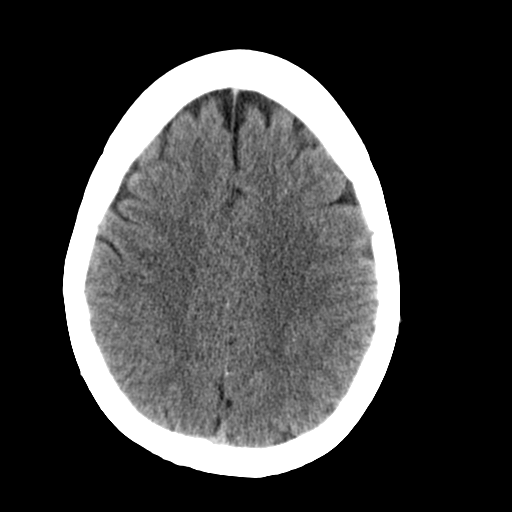
[im 20/31  bone]
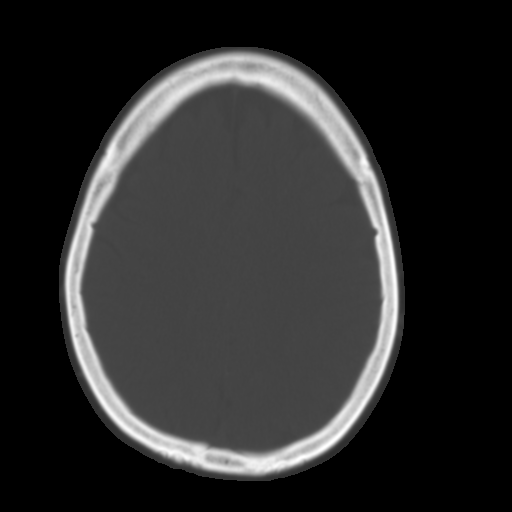
[im 22/31  brain]
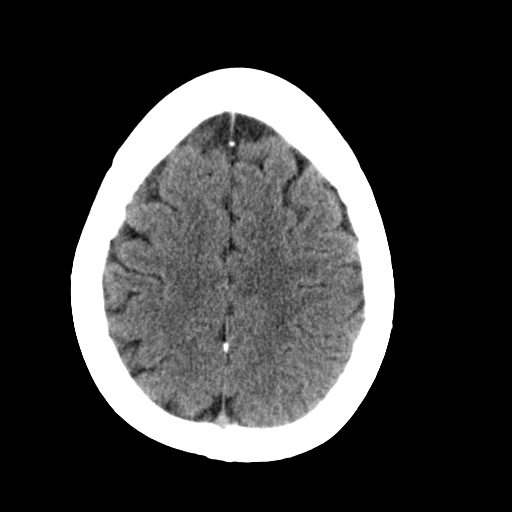
[im 24/31  brain]
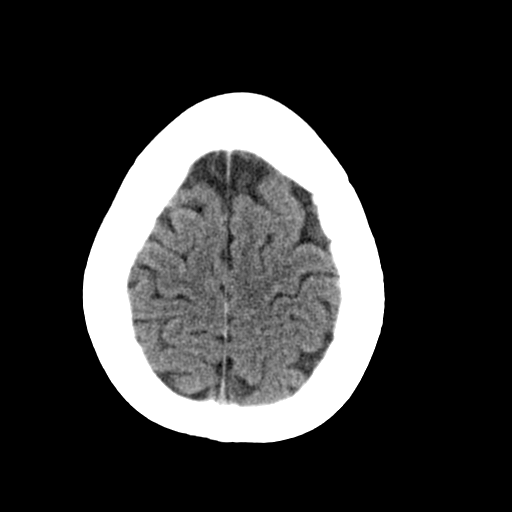
[im 26/31  brain]
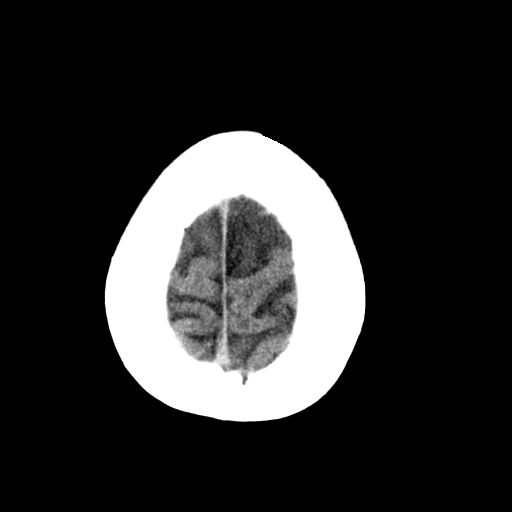
[im 28/31  brain]
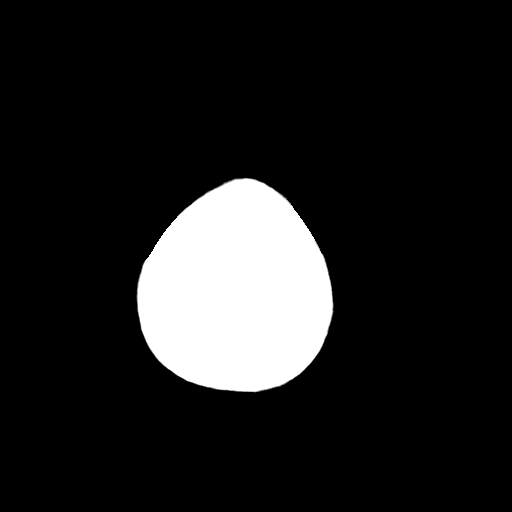
[im 28/31  bone]
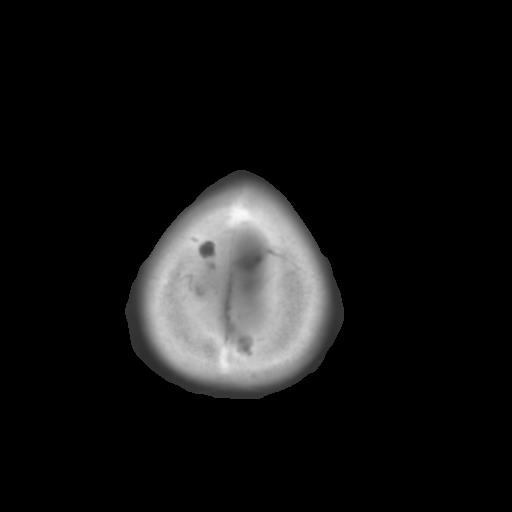

[Series 3: bone · axial · 0.41mm/px · z∈[+470,+510]mm · 3 of 31 slices shown]
[im 3/31  bone]
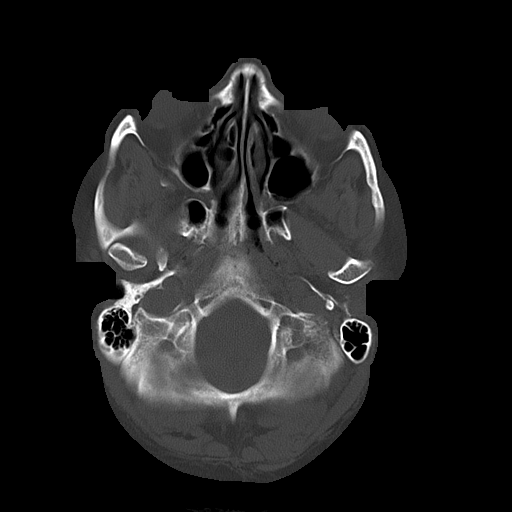
[im 7/31  bone]
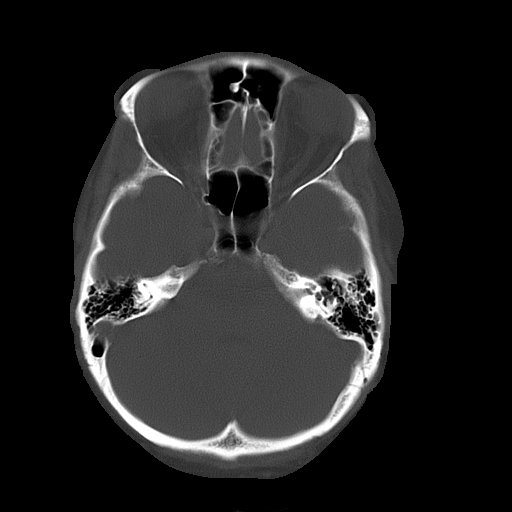
[im 11/31  bone]
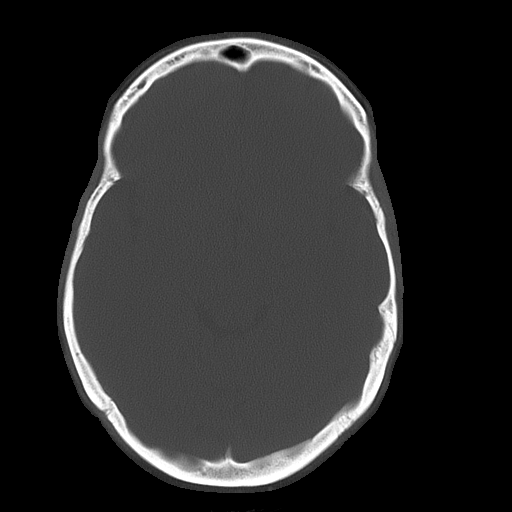

[16 of 30 positions shown; findings below may reference images not displayed]

FINDINGS: There is no evidence for mass effect, midline shift, or extra-axial fluid
collections. There is no evidence for space-occupying lesion, intracranial
hemorrhage, or cortical-based area of infarction.

The osseous structures are unremarkable.
IMPRESSION: No acute intracranial process.

## 2012-11-08 ENCOUNTER — Emergency Department: Admit: 2012-11-09 | Payer: PRIVATE HEALTH INSURANCE

## 2012-11-08 DIAGNOSIS — K859 Acute pancreatitis without necrosis or infection, unspecified: Secondary | ICD-10-CM

## 2012-11-09 ENCOUNTER — Inpatient Hospital Stay
Admit: 2012-11-09 | Discharge: 2012-11-09 | Disposition: A | Discharge status: Home / Self Care | Payer: PRIVATE HEALTH INSURANCE | Attending: Emergency Medical Services

## 2012-11-09 LAB — CBC WITH AUTO DIFFERENTIAL
Basophils %: 0 % (ref 0–2)
Basophils, Absolute: 0 10*3/uL (ref 0–0.2)
Eosinophils %: 4 % (ref 0–7)
Eosinophils, Absolute: 0.4 10*3/uL (ref 0–0.7)
HCT: 38.2 % (ref 37.0–48.0)
Hgb: 13.1 gm/dL (ref 12.0–16.0)
Lymphocytes %: 25 % (ref 25–45)
Lymphocytes, Absolute: 2.4 10*3/uL (ref 1.1–4.3)
MCH: 32.1 pg (ref 27–34)
MCHC: 34.2 gm/dL (ref 32–36)
MCV: 93.7 fL (ref 81–99)
MPV: 7.5 fL (ref 7.4–10.4)
Monocytes %: 5 % (ref 0–12)
Monocytes, Absolute: 0.5 10*3/uL (ref 0–1.2)
Neutrophils, Absolute: 6.4 10*3/uL (ref 1.6–7.3)
Platelet Count: 295 10*3/uL (ref 150–400)
RBC: 4.08 10*6/uL — ABNORMAL LOW (ref 4.20–5.40)
RDW: 13.4 % (ref 11.5–14.5)
Segs (Neutrophils)%: 66 % (ref 35–70)
WBC: 9.8 10*3/uL (ref 4.8–10.8)

## 2012-11-09 LAB — URINALYSIS, ROUTINE
Bacteria, UA: 81 /HPF (ref 0–92)
Bilirubin, urine: NEGATIVE
Glucose, UA: NEGATIVE mg/dl
Hyaline Casts, UA: 6 /LPF — ABNORMAL HIGH (ref 0–5)
Ketones, urine: NEGATIVE
Leukocyte Esterase, urine: NEGATIVE
Nitrite, urine: NEGATIVE
Protein, urine: NEGATIVE
RBC, UA: 4 /HPF (ref 0–5)
Specific Gravity, urine: 1.019 (ref 1.003–1.030)
Squamous Epithelial, UA: 3 /HPF (ref 0–5)
Urobilinogen, urine: NEGATIVE EU/dL
WBC, UA: 2 /HPF (ref 0–3)
pH, UA: 6 (ref 5.0–8.5)

## 2012-11-09 LAB — BASIC METABOLIC PANEL
Anion Gap: 7 mmol/L (ref 3–11)
BUN: 9 mg/dL (ref 8–21)
CO2 - Carbon Dioxide: 24 mmol/L (ref 22–31)
Calcium: 8.6 mg/dL (ref 8.6–10.0)
Chloride: 107 mmol/L (ref 98–111)
Creatinine, Serum: 0.94 mg/dL (ref 0.44–1.03)
GFR Estimate: >60 mL/min/{1.73_m2} (ref >60)
Glucose: 115 mg/dL — ABNORMAL HIGH (ref 80–99)
Potassium: 3.5 mmol/L (ref 3.5–5.1)
Sodium: 138 mmol/L (ref 135–143)

## 2012-11-09 LAB — LIPASE: Lipase: 380 U/L — CR (ref 15–50)

## 2012-11-09 LAB — HEPATIC FUNCTION PANEL
ALT - Alanine Amino transferase: 44 IU/L (ref 10–45)
AST - Aspartate Aminotransferase: 97 IU/L — ABNORMAL HIGH (ref 10–42)
Albumin: 3.3 gm/dL — ABNORMAL LOW (ref 3.5–5.0)
Alkaline Phosphatase: 103 IU/L (ref 37–107)
Bilirubin, Direct: 0.2 mg/dL (ref 0.0–0.2)
Bilirubin, Total: 0.7 mg/dL (ref 0.2–1.4)
Protein, Total: 5.8 gm/dL — ABNORMAL LOW (ref 6.1–7.9)

## 2012-11-09 MED ORDER — ketorolac (TORADOL) injection 15 mg
30 | Freq: Once | INTRAMUSCULAR | Status: AC
Start: 2012-11-09 — End: 2012-11-08
  Administered 2012-11-09: 02:00:00 30 mg via INTRAVENOUS

## 2012-11-09 MED ORDER — sodium chloride 0.9% (NS) syringe 10 mL
INTRAMUSCULAR | Status: DC | PRN
Start: 2012-11-09 — End: 2012-11-08

## 2012-11-09 MED ORDER — sodium chloride 0.9% (NS) syringe 10 mL
Freq: Three times a day (TID) | INTRAMUSCULAR | Status: DC
Start: 2012-11-09 — End: 2012-11-08
  Administered 2012-11-09: 02:00:00 via INTRAVENOUS

## 2012-11-09 MED ORDER — ondansetron (ZOFRAN) 4 mg/2 mL injection 4 mg
4 | INTRAMUSCULAR | Status: AC | PRN
Start: 2012-11-09 — End: 2012-11-08
  Administered 2012-11-09 (×2): 4 mg via INTRAVENOUS

## 2012-11-09 MED ORDER — prochlorperazine (COMPAZINE) injection 10 mg
10 | Freq: Four times a day (QID) | INTRAMUSCULAR | Status: DC | PRN
Start: 2012-11-09 — End: 2012-11-08
  Administered 2012-11-09: 05:00:00 10 mg via INTRAVENOUS

## 2012-11-09 MED ORDER — HYDROmorphone (DILAUDID) syringe 1-2 mg
1 | INTRAMUSCULAR | Status: DC | PRN
Start: 2012-11-09 — End: 2012-11-08
  Administered 2012-11-09: 02:00:00 1 mg via INTRAVENOUS

## 2012-11-09 MED ORDER — HYDROmorphone (DILAUDID) syringe 1-2 mg
1 | INTRAMUSCULAR | Status: AC | PRN
Start: 2012-11-09 — End: 2012-11-08
  Administered 2012-11-09 (×2): 1 mg via INTRAVENOUS

## 2012-11-09 MED FILL — KETOROLAC 30 MG/ML (1 ML) INJECTION SOLUTION: 30 mg/mL | INTRAMUSCULAR | Qty: 1 | Fill #0

## 2012-11-09 MED FILL — ONDANSETRON HCL (PF) 4 MG/2 ML INJECTION SOLUTION: 4 | INTRAMUSCULAR | Qty: 2 | Fill #0

## 2012-11-09 MED FILL — PROCHLORPERAZINE EDISYLATE 10 MG/2 ML (5 MG/ML) INJECTION SOLUTION: 10 mg/2 mL (5 mg/mL) | INTRAMUSCULAR | Qty: 2 | Fill #0

## 2012-11-09 MED FILL — HYDROMORPHONE (PF) 1 MG/ML INJECTION SYRINGE: 1 mg/mL | INTRAMUSCULAR | Qty: 2 | Fill #0

## 2012-11-09 MED FILL — HYDROMORPHONE (PF) 1 MG/ML INJECTION SYRINGE: 1 mg/mL | INTRAMUSCULAR | Qty: 1 | Fill #0

## 2013-11-02 ENCOUNTER — Emergency Department: Payer: Self-pay | Admitting: Emergency Medicine

## 2013-11-02 LAB — BASIC METABOLIC PANEL
Anion Gap: 2 — ABNORMAL LOW (ref 7–16)
BUN: 8 mg/dL (ref 7–18)
CREATININE: 0.82 mg/dL (ref 0.60–1.30)
Calcium, Total: 8.9 mg/dL (ref 8.5–10.1)
Chloride: 105 mmol/L (ref 98–107)
Co2: 29 mmol/L (ref 21–32)
EGFR (African American): >60
EGFR (Non-African Amer.): >60
Glucose: 100 mg/dL — ABNORMAL HIGH (ref 65–99)
OSMOLALITY: 270 (ref 275–301)
Potassium: 3.5 mmol/L (ref 3.5–5.1)
Sodium: 136 mmol/L (ref 136–145)

## 2013-11-02 LAB — URINALYSIS, COMPLETE
BILIRUBIN, UR: NEGATIVE
Blood: NEGATIVE
Glucose,UR: NEGATIVE mg/dL (ref 0–75)
Ketone: NEGATIVE
Nitrite: NEGATIVE
PROTEIN: NEGATIVE
Ph: 5 (ref 4.5–8.0)
RBC,UR: 4 /HPF (ref 0–5)
SPECIFIC GRAVITY: 1.019 (ref 1.003–1.030)

## 2013-11-02 LAB — CBC
HCT: 38.2 % (ref 35.0–47.0)
HGB: 13.5 g/dL (ref 12.0–16.0)
MCH: 31.2 pg (ref 26.0–34.0)
MCHC: 35.2 g/dL (ref 32.0–36.0)
MCV: 88 fL (ref 80–100)
Platelet: 151 10*3/uL (ref 150–440)
RBC: 4.32 10*6/uL (ref 3.80–5.20)
RDW: 13.9 % (ref 11.5–14.5)
WBC: 6.9 10*3/uL (ref 3.6–11.0)

## 2013-11-02 LAB — PRO B NATRIURETIC PEPTIDE: B-Type Natriuretic Peptide: 86 pg/mL (ref 0–125)

## 2013-11-02 LAB — HEPATIC FUNCTION PANEL A (ARMC)
Albumin: 3.8 g/dL
Alkaline Phosphatase: 54 U/L
Bilirubin, Direct: 0.2 mg/dL
Bilirubin,Total: 0.7 mg/dL
SGOT(AST): 9 U/L — ABNORMAL LOW
SGPT (ALT): 17 U/L
Total Protein: 7 g/dL

## 2013-11-02 LAB — TROPONIN I: Troponin-I: <0.02 ng/mL

## 2014-04-20 ENCOUNTER — Emergency Department: Admit: 2014-04-21 | Payer: PRIVATE HEALTH INSURANCE

## 2014-04-20 ENCOUNTER — Inpatient Hospital Stay
Admit: 2014-04-20 | Discharge: 2014-04-21 | Disposition: A | Discharge status: Home / Self Care | Payer: PRIVATE HEALTH INSURANCE | Attending: Emergency

## 2014-04-20 DIAGNOSIS — R1013 Epigastric pain: Secondary | ICD-10-CM

## 2014-04-20 NOTE — Discharge Instructions (Signed)
Continue Protonix. Return if vomiting, abdominal distention, recurrence of black tarry stool, lightheadedness, or for any new concern. See her doctor promptly to follow-up and plan further evaluation and management.    Abdominal Pain (Nonspecific)  Your exam might not show the exact reason you have abdominal pain. Since there are many different causes of abdominal pain, another checkup and more tests may be needed. It is very important to follow up for lasting (persistent) or worsening symptoms. A possible cause of abdominal pain in any person who still has his or her appendix is acute appendicitis. Appendicitis is often hard to diagnose. Normal blood tests, urine tests, ultrasound, and CT scans do not completely rule out early appendicitis or other causes of abdominal pain. Sometimes, only the changes that happen over time will allow appendicitis and other causes of abdominal pain to be determined. Other potential problems that may require surgery may also take time to become more apparent. Because of this, it is important that you follow all of the instructions below.  HOME CARE INSTRUCTIONS   ? Rest as much as possible.  ? Do not eat solid food until your pain is gone.  ? While adults or children have pain: A diet of water, weak decaffeinated tea, broth or bouillon, gelatin, oral rehydration solutions (ORS), frozen ice pops, or ice chips may be helpful.  ? When pain is gone in adults or children: Start a light diet (dry toast, crackers, applesauce, or white rice). Increase the diet slowly as long as it does not bother you. Eat no dairy products (including cheese and eggs) and no spicy, fatty, fried, or high-fiber foods.  ? Use no alcohol, caffeine, or cigarettes.  ? Take your regular medicines unless your caregiver told you not to.  ? Take any prescribed medicine as directed.  ? Only take over-the-counter or prescription medicines for pain, discomfort, or fever as directed by your caregiver. Do not give aspirin  to children.  If your caregiver has given you a follow-up appointment, it is very important to keep that appointment. Not keeping the appointment could result in a permanent injury and/or lasting (chronic) pain and/or disability. If there is any problem keeping the appointment, you must call to reschedule.   SEEK IMMEDIATE MEDICAL CARE IF:   ? Your pain is not gone in 24 hours.  ? Your pain becomes worse, changes location, or feels different.  ? You or your child has an oral temperature above 102? F (38.9? C), not controlled by medicine.  ? Your baby is older than 3 months with a rectal temperature of 102? F (38.9? C) or higher.  ? Your baby is 59 months old or younger with a rectal temperature of 100.4? F (38? C) or higher.  ? You have shaking chills.  ? You keep throwing up (vomiting) or cannot drink liquids.  ? There is blood in your vomit or you see blood in your bowel movements.  ? Your bowel movements become dark or black.  ? You have frequent bowel movements.  ? Your bowel movements stop (become blocked) or you cannot pass gas.  ? You have bloody, frequent, or painful urination.  ? You have yellow discoloration in the skin or whites of the eyes.  ? Your stomach becomes bloated or bigger.  ? You have dizziness or fainting.  ? You have chest or back pain.  MAKE SURE YOU:   ? Understand these instructions.  ? Will watch your condition.  ? Will get help right  away if you are not doing well or get worse.  Document Released: 09/18/2005 Document Revised: 12/11/2011 Document Reviewed: 08/16/2009  ExitCare? Patient Information ?2014 [place], [place].

## 2014-04-20 NOTE — ED Provider Notes (Signed)
[facility] EMERGENCY DEPARTMENT ENCOUNTER    History and Physical     Name: Diana Fuller  DOB: Nov 26, 1965 48 y.o.  MRN: [id number]  CSN: [id number]    HISTORY:     CHIEF COMPLAINT    Chief Complaint   Patient presents with   . Abdominal Pain   . Emesis   . Diarrhea       HPI    Diana Fuller is a 48 y.o. female with a history of a duodenal ulcer and patient also believes she may have had pancreatitis, although this is not certain, presents with epigastric abdominal pain, which is worse with food, and also worse with stress. The patient has been under a lot of stress lately with her 65 year old daughter living with her and problems with a grandchild and her mother. The patient's husband, who is disabled, says that she tries to hold everything together and this ends up causing a lot of worsening distress with pain. The pain is sharp in the epigastric area and radiating to the back. The patient has a history of a cholecystectomy. She says this feels similar to her prior bouts of ulcers. She is known to have a duodenal ulcer. She has been considering some dietary changes but has not yet implemented them. A few days ago she had a couple of episodes of black tarry stool but since that time it has resolved and now her stool has gone back to its usual loose normal character. No vomiting or hematemesis.    PAST MEDICAL HISTORY    Past Medical History   Diagnosis Date   . Migraine    . Bipolar affective    . Duodenal ulcer    . Ulcerative colitis    . Pancreatitis    . Mass of pancreas        SURGICAL HISTORY    Past Surgical History   Procedure Laterality Date   . Cholecystectomy     . Hysterectomy     . Cholecystectomy     . Knee surgery     . Bunionectomy     . Tonsillectomy         CURRENT MEDICATIONS    Prior to Admission medications    Medication Sig Start Date End Date Taking? Authorizing Provider   ALBUTEROL SULFATE (PROAIR HFA INHL) Inhale into the lungs as needed.    Historical Provider, MD      aspirin-acetaminophen-caffeine [facility] EXTRA STRENGTH) 250-250-65 mg per tablet Take 1 tablet by mouth every 6 (six) hours as needed.    Historical Provider, MD   estradiol (ESTRACE) 1 MG tablet Take 1 mg by mouth every morning.    Historical Provider, MD   FLUoxetine (PROZAC) 20 MG tablet Take 20 mg by mouth daily.    Historical Provider, MD   lisinopril (PRINIVIL,ZESTRIL) 5 MG tablet Take 5 mg by mouth daily.    Historical Provider, MD   lithium 300 mg tablet Take 300 mg by mouth 3 (three) times daily.    Historical Provider, MD   pantoprazole (PROTONIX) 40 MG tablet Take 40 mg by mouth BID.    Historical Provider, MD   sucralfate (CARAFATE) 1 gram tablet Take 1 g by mouth 4 (four) times daily.    Historical Provider, MD       ALLERGIES    Review of patient's allergies indicates no known allergies.    FAMILY HISTORY    Family History   Problem Relation Age of Onset   . Kidney  disease Father    . Seizures Daughter        SOCIAL HISTORY    History   Substance Use Topics   . Smoking status: Former Smoker -- 0.50 packs/day   . Smokeless tobacco: Not on file   . Alcohol Use: No       REVIEW OF SYSTEMS:   Constitutional:  No fever or chills.  Eyes:  No photophobia or discharge.    HENT:  No sore throat.  Respiratory:  No cough or shortness of breath.   Cardiovascular:  No palpitations or swelling.   GI:  As above.   Musculoskeletal:  No extremity complaints.   Skin:  No rash.  Neurologic:  No focal weakness.   All systems negative except as marked.     PHYSICAL EXAM:   VITAL SIGNS:      Initial Vitals   BP 04/20/14 1646 127/89 mmHg   Heart Rate 04/20/14 1646 76   Resp 04/20/14 1646 16   Temp 04/20/14 1646 36.7 ?C (98.1 ?F)   SpO2 04/20/14 1646 96 %       Constitutional:  Well developed, moderate distress.   HENT:  Normocephalic. Bilateral external ears normal, Oropharynx clear, Nose normal. Neck- Normal range of motion, No tenderness, Supple, No stridor.   Eyes:  PERRL, EOMI, Conjunctiva normal, No discharge.    Respiratory:  Breath sounds normal.   Cardiovascular: Rate and rhythm: Normal. No murmurs, rubs, or gallops.   GI:  Tender to palpation in the epigastric area and left upper quadrant. Negative [place] sign. No tenderness below the umbilicus.. No obvious masses.   GU: No costovertebral angle tenderness.  Musculoskeletal: No cyanosis. No edema. Calf circumference symmetric. Back- No tenderness.   Integument:  Warm, Dry, No erythema, No rash.   Neurologic:  Cranial nerves symmetric. Grip strength symmetric, No focal deficits noted. Gait normal  Psychiatric:  Appropriate    MEDICAL DECISION MAKING  Differential diagnoses includes pancreatitis, choledocholithiasis, recurrent peptic ulcer disease, perforated ulcer. Doubt ureterolithiasis, doubt pyelonephritis, doubt bowel obstruction. Symptoms are not suggestive of acute MI.    DATA:     LABS    Results for orders placed or performed during the hospital encounter of 04/20/14 (from the past 24 hour(s))   CBC with Auto Differential -STAT    Collection Time: 04/20/14  5:56 PM   Result Value Ref Range    WBC 9.1 4.8 - 10.8 K/microL    RBC 4.18 (L) 4.20 - 5.40 M/microL    Hgb 12.7 12.0 - 16.0 gm/dL    HCT 28.4 13.2 - 44.0 %    MCV 94.0 81 - 99 fL    MCH 30.5 27 - 34 pg    MCHC 32.4 32 - 36 gm/dL    RDW 10.2 72.5 - 36.6 %    Platelet Count 333 150 - 400 K/microL    MPV 7.6 7.4 - 10.4 fL    Differential Type AUTO     Neutrophils % 66 35 - 70 %    Lymphocytes % 24 (L) 25 - 45 %    Monocytes % 5 0 - 12 %    Eosinophils % 4 0 - 7 %    Basophils % 1 0 - 2 %    Neutrophils, Absolute 6.0 1.6 - 7.3 K/microL    Lymphocytes, Absolute 2.2 1.1 - 4.3 K/microL    Monocytes, Absolute 0.5 0 - 1.2 K/microL    Eosinophils, Absolute 0.4 0 - 0.7 K/microL  Basophils, Absolute 0.0 0 - 0.2 K/microL   Comprehensive Metabolic Panel -STAT    Collection Time: 04/20/14  5:56 PM   Result Value Ref Range    Sodium 137 135 - 143 mmol/L    Potassium 3.6 3.5 - 5.1 mmol/L    Chloride 108 98 - 111 mmol/L     CO2 - Carbon Dioxide 23.0 22 - 31 mmol/L    Glucose 93 80 - 99 mg/dL    BUN 8 8 - 21 mg/dL    Creatinine, Serum 1.61 0.44 - 1.03 mg/dL    Calcium 9.1 8.6 - 09.6 mg/dL    AST - Aspartate Aminotransferase 17 10 - 42 IU/L    ALT - Alanine Amino transferase 14 10 - 45 IU/L    Alkaline Phosphatase 93 37 - 107 IU/L    Bilirubin, Total 0.3 0.2 - 1.4 mg/dL    Protein, Total 6.4 6.1 - 7.9 gm/dL    Albumin 3.4 (L) 3.5 - 5.0 gm/dL    Globulin 3.0 2.2 - 3.7 gm/dL    Albumin/Globulin Ratio 1.1 >0.9    Anion Gap 6.0 3 - 11 mmol/L    GFR Estimate >60 >60 mL/min/1.73sq.m    GFR Additional Info       For African Americans, multiply EGFR x 1.21.  See [email or web address]   Lipase -STAT    Collection Time: 04/20/14  5:56 PM   Result Value Ref Range    Lipase 18 15 - 50 U/L   Urinalysis, Routine -STAT    Collection Time: 04/20/14  6:22 PM   Result Value Ref Range    Urine Color YELLOW     Urine Character CLEAR     Glucose, urine NEGATIVE NEGATIVE mg/dl    Bilirubin, urine NEGATIVE NEGATIVE    Ketones, urine NEGATIVE NEGATIVE    Specific Gravity, urine 1.008 1.003 - 1.030    Blood, urine SMALL (A) NEGATIVE    pH, UA 6.5 5.0 - 8.5    Protein, urine NEGATIVE NEGATIVE    Urobilinogen, urine NEGATIVE NEGATIVE EU/dL    Nitrite, urine NEGATIVE NEGATIVE    Leukocyte Esterase, urine NEGATIVE NEGATIVE    WBC, UA 0 0 - 9 /HPF    Squamous Epithelial, UA 1 0 - 5 /HPF    Bacteria, UA 9 0 - 92 /HPF    RBC, UA 5 0 - 5 /HPF    Hyaline Casts, UA 1 0 - 5 /LPF       RADIOLOGY/PROCEDURES    CT abdomen pelvis with IV contrast   Final Result         CT ABDOMEN PELVIS W IV CONTRAST   04/20/2014 19:02:38      PROVIDED HISTORY: Sharp upper abdominal pain       ADDITIONAL HISTORY: None.      COMPARISONS: Comparison made with CT of the abdomen pelvis dated    05/03/2009      TECHNIQUE: Following administration of intravenous contrast material,    helical CT imaging is performed from the domes of the diaphragm to the    symphysis pubis, during the parenchymal phase  of contrast enhancement.     Axial images are submitted as well as    sagittal and coronal 2-dimensional reformations.         FINDINGS: The visualized lung bases are clear.  No pericardial or pleural    effusion is seen.      Changes of prior cholecystectomy are again noted.  Prominent common bile  duct is similar to prior measuring 7 mm.  No intrahepatic biliary ductal    dilatation is seen.      The liver, pancreas, spleen, adrenal glands, and kidneys are normal.      Abdominal aorta is normal in course and caliber.      The bowel is decompressed without evidence of obstruction.  Scattered    distal colonic diverticula are present without evidence of diverticulitis.     Appendix is normal.      The no intra-abdominal free air, free fluid, adenopathy, or mass.            Urinary bladder is distended grossly normal in appearance.  Changes of    prior TAH-BSO are noted.  Vaginal cuff is unremarkable.  No pelvic    sidewall or inguinal adenopathy or mass lesion.  No pelvic free fluid is    seen.      Mild osteophyte formation noted at both sacroiliac joints.      Mild anterior wedging at T11 and T12 demonstrate a 25% and 30% loss of    anterior body height respectively and are unchanged comparison to prior    study.  Mild lower lumbar facet arthropathy is present.  No acute fracture    or destructive osseous lesion.      IMPRESSION:       No evidence for acute abdominal or pelvic abnormality.      Changes of prior cholecystectomy and TAH-BSO.      Uncomplicated distal colonic diverticulosis.      Chronic anterior wedging at T11 and T12, unchanged from prior.          PLAN:     ED COURSE  Pertinent Labs & Imaging studies reviewed. (See chart for details)   Patient is improved with IV fluids and analgesia. When I reevaluated at 2005, she is more comfortable but when her husband began talking about the stressful situation at home, the patient said this talk was acutely worsening the epigastric discomfort. Clearly the  patient's social situation and stress are exacerbating her pain, although they probably are not the main cause. Peptic ulcer disease is suspected. The patient is already on Protonix and I recommend she continue this and I'll prescribe hydrocodone for her to use the next 2 or 3 days. She may follow up with her primary doctor in 2 or 3 days to plan further evaluation and management. We reviewed return precautions including melena, hematemesis, hematochezia, vomiting, abdominal distention, or if she is not improved within the next couple of days.    ASSESSMENT  48 y.o. female with epigastric pain      Winfield Cunas, MD  04/20/14 2006

## 2014-04-20 NOTE — ED Notes (Signed)
Assumed pt care. Pt resting in gurney on left side, A&Ox4, pleasant and cooperative, talking with husband, breathing even and unlabored, laying on left side guarding LUQ, medicated for 10/10 pain, denies nausea at this time. Pt to CT.

## 2014-04-20 NOTE — ED Notes (Signed)
Agree with triage note, pt states episode was triggered after eating bacon. Pt appears pale, warm and dry. States she was able to eat soup last night but nothing since due to nausea. Pt on monitor call light in reach husband at bedside.

## 2014-04-20 NOTE — ED Provider Notes (Signed)
I saw this patient in my role as the the ED provider at triage. As such I did a very limited history and physical exam to refine the patient's triage level and order appropriate studies to begin the patient's workup. The patient understands that a complete evaluation will be done when they are brought back to an ED room.   Patient is stable to wait for a room in the emergency department.  Med list and history confirmed.       Patient has a chief complaint of LUQ pain since Thursday. + nausea, vomiting. History of ulcerative colitis, pancreatitis, mass on pancreas.   I ordered labwork and urine ordered.       Elnora Morrison, NP  04/20/14 1710

## 2014-04-20 NOTE — ED Notes (Signed)
SBAR to [employer]

## 2014-04-20 NOTE — ED Notes (Signed)
Pt back from CT

## 2014-04-20 NOTE — ED Notes (Signed)
IVF placed on pressure bag to increase infusion rate. Pt in gurney continues to c/o 10/10 stomach pain. C/o nausea. Awake and alert, oxygen discontinued.

## 2014-04-20 NOTE — ED Notes (Signed)
Sent over by PCP for workup of pancreatitis. Patient states last episode one year ago. Has been having abd pain with N/V/D since Thursday.

## 2014-04-20 NOTE — ED Notes (Addendum)
Pt oxygen sat. Down to 89%, pt awake but sleepy. Encouraged deep breathing and placed pt on oxygen @ 2 lpm via NC. O2 sat improved to 96%.

## 2014-04-20 NOTE — ED Notes (Signed)
PT up to bathroom to given urine sample

## 2014-04-21 LAB — COMPREHENSIVE METABOLIC PANEL
ALT - Alanine Amino transferase: 14 IU/L (ref 10–45)
AST - Aspartate Aminotransferase: 17 IU/L (ref 10–42)
Albumin/Globulin Ratio: 1.1 (ref >0.9)
Albumin: 3.4 gm/dL — ABNORMAL LOW (ref 3.5–5.0)
Alkaline Phosphatase: 93 IU/L (ref 37–107)
Anion Gap: 6 mmol/L (ref 3–11)
BUN: 8 mg/dL (ref 8–21)
Bilirubin, Total: 0.3 mg/dL (ref 0.2–1.4)
CO2 - Carbon Dioxide: 23 mmol/L (ref 22–31)
Calcium: 9.1 mg/dL (ref 8.6–10.0)
Chloride: 108 mmol/L (ref 98–111)
Creatinine, Serum: 0.73 mg/dL (ref 0.44–1.03)
GFR Estimate: >60 mL/min/{1.73_m2} (ref >60)
Globulin: 3 gm/dL (ref 2.2–3.7)
Glucose: 93 mg/dL (ref 80–99)
Potassium: 3.6 mmol/L (ref 3.5–5.1)
Protein, Total: 6.4 gm/dL (ref 6.1–7.9)
Sodium: 137 mmol/L (ref 135–143)

## 2014-04-21 LAB — CBC WITH AUTO DIFFERENTIAL
Basophils %: 1 % (ref 0–2)
Basophils, Absolute: 0 10*3/uL (ref 0–0.2)
Eosinophils %: 4 % (ref 0–7)
Eosinophils, Absolute: 0.4 10*3/uL (ref 0–0.7)
HCT: 39.3 % (ref 37.0–48.0)
Hgb: 12.7 gm/dL (ref 12.0–16.0)
Lymphocytes %: 24 % — ABNORMAL LOW (ref 25–45)
Lymphocytes, Absolute: 2.2 10*3/uL (ref 1.1–4.3)
MCH: 30.5 pg (ref 27–34)
MCHC: 32.4 gm/dL (ref 32–36)
MCV: 94 fL (ref 81–99)
MPV: 7.6 fL (ref 7.4–10.4)
Monocytes %: 5 % (ref 0–12)
Monocytes, Absolute: 0.5 10*3/uL (ref 0–1.2)
Neutrophils, Absolute: 6 10*3/uL (ref 1.6–7.3)
Platelet Count: 333 10*3/uL (ref 150–400)
RBC: 4.18 10*6/uL — ABNORMAL LOW (ref 4.20–5.40)
RDW: 13.5 % (ref 11.5–14.5)
Segs (Neutrophils)%: 66 % (ref 35–70)
WBC: 9.1 10*3/uL (ref 4.8–10.8)

## 2014-04-21 LAB — URINALYSIS, ROUTINE
Bacteria, UA: 9 /HPF (ref 0–92)
Bilirubin, urine: NEGATIVE
Glucose, UA: NEGATIVE mg/dl
Hyaline Casts, UA: 1 /LPF (ref 0–5)
Ketones, urine: NEGATIVE
Leukocyte Esterase, urine: NEGATIVE
Nitrite, urine: NEGATIVE
Protein, urine: NEGATIVE
RBC, UA: 5 /HPF (ref 0–5)
Specific Gravity, urine: 1.008 (ref 1.003–1.030)
Squamous Epithelial, UA: 1 /HPF (ref 0–5)
Urobilinogen, urine: NEGATIVE EU/dL
WBC, UA: 0 /HPF (ref 0–9)
pH, UA: 6.5 (ref 5.0–8.5)

## 2014-04-21 LAB — LIPASE: Lipase: 18 U/L (ref 15–50)

## 2014-04-21 MED ORDER — iohexol (OMNIPAQUE) 350 mg iodine/mL injection 85 mL
350 | Freq: Once | INTRAVENOUS | Status: AC | PRN
Start: 2014-04-21 — End: 2014-04-20
  Administered 2014-04-21: 02:00:00 350 mL via INTRAVENOUS

## 2014-04-21 MED ORDER — HYDROcodone-acetaminophen (NORCO) tablet 7.5-325 mg (starter pack)(6 tabs)
7.5-325 | ORAL | Status: DC
Start: 2014-04-21 — End: 2014-04-20
  Administered 2014-04-21: 04:00:00 7.5-325 via ORAL

## 2014-04-21 MED ORDER — HYDROmorphone (DILAUDID) syringe 1 mg
1 | INTRAMUSCULAR | Status: AC | PRN
Start: 2014-04-21 — End: 2014-04-20
  Administered 2014-04-21 (×2): 1 mg via INTRAVENOUS

## 2014-04-21 MED ORDER — ondansetron (ZOFRAN-ODT) 8 mg disintegrating tablet (starter pack)(1 tab)
8 | Freq: Once | ORAL | Status: AC
Start: 2014-04-21 — End: 2014-04-20

## 2014-04-21 MED ORDER — HYDROcodone-acetaminophen (NORCO) tablet 5-325 mg (starter pack)(6 tabs)
5-325 | Freq: Four times a day (QID) | ORAL | Status: DC | PRN
Start: 2014-04-21 — End: 2014-04-20

## 2014-04-21 MED ORDER — ondansetron (ZOFRAN-ODT) disintegrating tablet 8 mg
8 | Freq: Once | ORAL | Status: AC
Start: 2014-04-21 — End: 2014-04-20
  Administered 2014-04-21: 03:00:00 8 mg via ORAL

## 2014-04-21 MED ORDER — sodium chloride (NS) 0.9 % bolus 0.9% bolus 1,000 mL
Freq: Once | INTRAVENOUS | Status: AC
Start: 2014-04-21 — End: 2014-04-20
  Administered 2014-04-21 (×2): via INTRAVENOUS

## 2014-04-21 MED ORDER — ondansetron (ZOFRAN-ODT) 8 mg disintegrating tablet (starter pack)(1 tab) Omnicell Med
8 | ORAL | Status: AC
Start: 2014-04-21 — End: 2014-04-20
  Administered 2014-04-21: 04:00:00 8 MG via ORAL

## 2014-04-21 MED ORDER — HYDROcodone-acetaminophen (NORCO) 5-325 mg per tablet
5-325 | ORAL_TABLET | Freq: Four times a day (QID) | ORAL | 0.00 refills | 30.00000 days | Status: DC | PRN
Start: 2014-04-21 — End: 2014-04-28

## 2014-04-21 MED ORDER — HYDROcodone-acetaminophen (NORCO) 5-325 mg per tablet
5-325 | ORAL_TABLET | Freq: Four times a day (QID) | ORAL | 0.00 refills | 30.00000 days | Status: AC | PRN
Start: 2014-04-21 — End: 2014-05-20

## 2014-04-21 MED ORDER — ondansetron (ZOFRAN) injection 4 mg
4 | INTRAMUSCULAR | Status: AC | PRN
Start: 2014-04-21 — End: 2014-04-20
  Administered 2014-04-21 (×2): 4 mg via INTRAVENOUS

## 2014-04-21 MED FILL — HYDROCODONE-ACETAMINOPHEN 5 MG-325 MG TABLET (#6)(SP): 5-325 mg | ORAL | Qty: 6

## 2014-04-21 MED FILL — HYDROMORPHONE (PF) 1 MG/ML INJECTION SYRINGE: 1 mg/mL | INTRAMUSCULAR | Qty: 1

## 2014-04-21 MED FILL — ONDANSETRON 8 MG DISINTEGRATING TABLET (#1)(SP): 8 mg | ORAL | Qty: 1

## 2014-04-21 MED FILL — ONDANSETRON HCL (PF) 4 MG/2 ML INJECTION SOLUTION: 4 | INTRAMUSCULAR | Qty: 2

## 2014-04-21 MED FILL — HYDROCODONE-ACETAMINOPHEN 7.5 MG-325 MG TABLET (#6)(SP): 7.5-325 mg | ORAL | Qty: 36

## 2014-04-21 MED FILL — ONDANSETRON 8 MG DISINTEGRATING TABLET: 8 mg | ORAL | Qty: 1

## 2014-04-28 ENCOUNTER — Emergency Department: Admit: 2014-04-28 | Payer: PRIVATE HEALTH INSURANCE

## 2014-04-28 ENCOUNTER — Inpatient Hospital Stay
Admit: 2014-04-28 | Discharge: 2014-04-29 | Disposition: A | Discharge status: Home / Self Care | Payer: PRIVATE HEALTH INSURANCE | Attending: MD

## 2014-04-28 DIAGNOSIS — K259 Gastric ulcer, unspecified as acute or chronic, without hemorrhage or perforation: Secondary | ICD-10-CM

## 2014-04-28 LAB — COMPREHENSIVE METABOLIC PANEL
ALT - Alanine Amino transferase: 15 IU/L (ref 10–45)
AST - Aspartate Aminotransferase: 22 IU/L (ref 10–42)
Albumin/Globulin Ratio: 1.3 (ref >0.9)
Albumin: 3.3 gm/dL — ABNORMAL LOW (ref 3.5–5.0)
Alkaline Phosphatase: 80 IU/L (ref 37–107)
Anion Gap: 7 mmol/L (ref 3–11)
BUN: 6 mg/dL — ABNORMAL LOW (ref 8–21)
Bilirubin, Total: 0.4 mg/dL (ref 0.2–1.4)
CO2 - Carbon Dioxide: 26 mmol/L (ref 22–31)
Calcium: 8.7 mg/dL (ref 8.6–10.0)
Chloride: 106 mmol/L (ref 98–111)
Creatinine, Serum: 0.75 mg/dL (ref 0.44–1.03)
GFR Estimate: >60 mL/min/{1.73_m2} (ref >60)
Globulin: 2.5 gm/dL (ref 2.2–3.7)
Glucose: 97 mg/dL (ref 80–99)
Potassium: 3.6 mmol/L (ref 3.5–5.1)
Protein, Total: 5.8 gm/dL — ABNORMAL LOW (ref 6.1–7.9)
Sodium: 139 mmol/L (ref 135–143)

## 2014-04-28 LAB — CBC WITH AUTO DIFFERENTIAL
Basophils %: 1 % (ref 0–2)
Basophils, Absolute: 0.1 10*3/uL (ref 0–0.2)
Eosinophils %: 4 % (ref 0–7)
Eosinophils, Absolute: 0.3 10*3/uL (ref 0–0.7)
HCT: 37 % (ref 37.0–48.0)
Hgb: 12.2 gm/dL (ref 12.0–16.0)
Lymphocytes %: 25 % (ref 25–45)
Lymphocytes, Absolute: 1.8 10*3/uL (ref 1.1–4.3)
MCH: 30.7 pg (ref 27–34)
MCHC: 32.9 gm/dL (ref 32–36)
MCV: 93.5 fL (ref 81–99)
MPV: 7.1 fL — ABNORMAL LOW (ref 7.4–10.4)
Monocytes %: 7 % (ref 0–12)
Monocytes, Absolute: 0.5 10*3/uL (ref 0–1.2)
Neutrophils, Absolute: 4.6 10*3/uL (ref 1.6–7.3)
Platelet Count: 345 10*3/uL (ref 150–400)
RBC: 3.96 10*6/uL — ABNORMAL LOW (ref 4.20–5.40)
RDW: 13.6 % (ref 11.5–14.5)
Segs (Neutrophils)%: 63 % (ref 35–70)
WBC: 7.3 10*3/uL (ref 4.8–10.8)

## 2014-04-28 LAB — URINALYSIS, ROUTINE
Bacteria, UA: 190 /HPF — ABNORMAL HIGH (ref 0–92)
Bilirubin, urine: NEGATIVE
Epithelial, Other: 0 /HPF
Glucose, UA: NEGATIVE mg/dl
Hyaline Casts, UA: 10 /LPF — ABNORMAL HIGH (ref 0–5)
Ketones, urine: NEGATIVE
Leukocyte Esterase, urine: NEGATIVE
Nitrite, urine: NEGATIVE
Pathological Casts: NONE SEEN /LPF
Protein, urine: NEGATIVE
RBC, UA: 6 /HPF — ABNORMAL HIGH (ref 0–5)
Specific Gravity, urine: 1.011 (ref 1.003–1.030)
Squamous Epithelial, UA: 7 /HPF — ABNORMAL HIGH (ref 0–5)
Urobilinogen, urine: NEGATIVE EU/dL
WBC, UA: 5 /HPF (ref 0–9)
pH, UA: 7 (ref 5.0–8.5)

## 2014-04-28 LAB — LACTIC ACID ED: Lactic Acid: 1.6 mmol/L (ref 0.5–2.2)

## 2014-04-28 LAB — LIPASE: Lipase: 13 U/L — ABNORMAL LOW (ref 15–50)

## 2014-04-28 LAB — CREATININE ED
Creatinine Ed Sample: 0.77 mg/dL (ref 0.64–1.03)
GFR Estimate: >60 mL/min/{1.73_m2} (ref >60)

## 2014-04-28 MED ORDER — ondansetron (ZOFRAN) injection 4 mg
4 | Freq: Once | INTRAMUSCULAR | Status: AC
Start: 2014-04-28 — End: 2014-04-28
  Administered 2014-04-28: 23:00:00 4 mg via INTRAVENOUS

## 2014-04-28 MED ORDER — iohexol (OMNIPAQUE) 350 mg iodine/mL injection 85 mL
350 | Freq: Once | INTRAVENOUS | Status: AC | PRN
Start: 2014-04-28 — End: 2014-04-28
  Administered 2014-04-28: 23:00:00 350 mL via INTRAVENOUS

## 2014-04-28 MED ORDER — sodium chloride (NS) 0.9 % bolus 0.9% bolus 1,000 mL
Freq: Once | INTRAVENOUS | Status: AC
Start: 2014-04-28 — End: 2014-04-28
  Administered 2014-04-28 – 2014-04-29 (×2): via INTRAVENOUS

## 2014-04-28 MED ORDER — morphine injection 4 mg
4 | INTRAVENOUS | Status: AC | PRN
Start: 2014-04-28 — End: 2014-04-28
  Administered 2014-04-28 (×3): 4 mg via INTRAVENOUS

## 2014-04-28 MED FILL — MORPHINE 4 MG/ML INTRAVENOUS CARTRIDGE: 4 mg/mL | INTRAVENOUS | Qty: 1

## 2014-04-28 MED FILL — ONDANSETRON HCL (PF) 4 MG/2 ML INJECTION SOLUTION: 4 | INTRAMUSCULAR | Qty: 2

## 2014-04-28 NOTE — ED Notes (Signed)
C/O LEFT UPPER QUAD PAIN. NAUSEA, WITH VOMITING.   HX SEEN AT ED 6 DAYS AGO, GIVEN PAN SHOT HELPED, HOME WITH NAUSEA ZOFRAN AND HYDROCODONE. HASN'T TAKEN HYDROCONDONE, ZOFRAN DIDN'T HELP MUCH. PRETTY MUCH VOMITING AND PAIN X 6 DAYS. POSSIBLY ULCERS DX.  DENIES PROBLEMS PEEING OR POOPPING.  NO RADIATION.   VOMIT X 6 LAST 24 HOURS.

## 2014-04-28 NOTE — ED Notes (Signed)
RN at bedside. IV started, blood drawn, and meds given. Continuous pulse oximeter on, vss. Pt instructed on plan of care and wait time for tests. Call light within reach. Pt denies further needs.

## 2014-04-28 NOTE — ED Notes (Signed)
STATES MORPHINE HASN'T HELPED PAIN, A LITTLE SLEEPY, BUT THAT'S ALL.  TO BR TO GIVE URINE SPEC.

## 2014-04-28 NOTE — ED Provider Notes (Signed)
[facility] EM[facility]ENCY DEPART[facility] VISIT NOTE     History and Physical     Name: Diana Fuller  DOB: 1966/07/24 48 y.o.  MRN: [id number]  CSN: [id number]    HISTORY:     CHIEF COMPLAINT    Abdominal discomfort and vomiting    HPI      Patient is a 48 year old female presents to complaining of left lower abdominal discomfort as well as nausea with vomiting up to 6 times per day for the last several days. On review of her medical records she was seen in the emergency department on 04/20/14 for similar abdominal discomfort as well as emesis.  She has had no fevers or chills. She does note the pain has significantly worsened in her left lower quadrant. She had a CT abdomen and pelvis at the time of her previous visit which demonstrated diverticulosis and at that time no overt diverticulitis. No other intra-abdominal pathology was noted. Laboratory studies were otherwise unremarkable at that time. She indicates she is currently taking 20 mg of omeprazole and sucralfate. She has appointment with gastroenterology as she was diagnosed with possible peptic ulcer disease.    ONSET: Several days  LOCATION: Abdomen  SEVERITY: Moderate  QUALITY: Aching discomfort  CONTEXT: as per HPI  TIMING: acute  MODIFYING FACTORS: as above  ASSOCIATED SYMPTOMS: As above      REVIEW OF SYSTEMS:  Please see HPI.  All other systems were reviewed and are negative.    PAST MEDICAL HISTORY    Past Medical History   Diagnosis Date   . Migraine    . Bipolar affective    . Duodenal ulcer    . Ulcerative colitis    . Pancreatitis    . Mass of pancreas        SURGICAL HISTORY    Past Surgical History   Procedure Laterality Date   . Cholecystectomy     . Hysterectomy     . Cholecystectomy     . Knee surgery     . Bunionectomy     . Tonsillectomy          CURRENT MEDICATIONS    Previous Medications    ALBUTEROL SULFATE (PROAIR HFA INHL)    Inhale into the lungs as needed.    ASPIRIN-ACETAMINOPHEN-CAFFEINE (EXCEDRIN EXTRA STRENGTH) 250-250-65 MG  PER TABLET    Take 1 tablet by mouth every 6 (six) hours as needed.    ESTRADIOL (ESTRACE) 1 MG TABLET    Take 1 mg by mouth every morning.    FLUOXETINE (PROZAC) 20 MG TABLET    Take 20 mg by mouth daily.    HYDROCODONE-ACETAMINOPHEN (NORCO) 5-325 MG PER TABLET    Take 1-2 tablets by mouth every 6 (six) hours as needed for Pain for up to 30 days.    HYDROCODONE-ACETAMINOPHEN (NORCO) 5-325 MG PER TABLET    Take 1-2 tablets by mouth every 6 (six) hours as needed for Pain for up to 16 doses.    LISINOPRIL (PRINIVIL,ZESTRIL) 5 MG TABLET    Take 5 mg by mouth daily.    LITHIUM 300 MG TABLET    Take 300 mg by mouth 3 (three) times daily.    PANTOPRAZOLE (PROTONIX) 40 MG TABLET    Take 40 mg by mouth BID.    SUCRALFATE (CARAFATE) 1 GRAM TABLET    Take 1 g by mouth 4 (four) times daily.       ALLERGIES    Review of patient's allergies indicates no  known allergies.    FAMILY HISTORY    Family History   Problem Relation Age of Onset   . Kidney disease Father    . Seizures Daughter        SOCIAL HISTORY    History   Substance Use Topics   . Smoking status: Former Smoker -- 0.50 packs/day   . Smokeless tobacco: Not on file   . Alcohol Use: No         PHYSICAL EXAM:   INITIAL VITAL SIGNS:    Initial Vitals   BP 04/28/14 1325 125/77 mmHg   Heart Rate 04/28/14 1325 77   Resp 04/28/14 1325 16   Temp 04/28/14 1325 36.5 ?C (97.7 ?F)   SpO2 04/28/14 1325 97 %     Constitutional : No acute distress  HEENT:  Normocephalic, Atraumatic, PERRL. Oropharynx moist, no erythema or exudate.   Neck: Supple, no tenderness, no masses.   Chest:  Normal respiratory effort, clear breath sounds bilaterally.    Cardiovascular:  Regular rate and rhythm, 2+ radial pulses bilaterally.   GI:  Soft, no masses.  Patient does have discomfort in the left lower abdomen and only mild discomfort in the epigastric region however she does feel nauseous when palpating the epigastrium. No right upper quadrant discomfort and prior history of cholecystectomy. Normal  bowel sounds.  No rebound or guarding.  Extremities: Warm, well perfused.  Non tender. No edema.  Full range of motion  Skin:  No rashes or bruising noted.   Neurologic:  Alert & oriented x 3, nonfocal exam.   Psychiatric: Mood and affect normal.        RESULTS :     LABS    Results for orders placed or performed during the hospital encounter of 04/28/14 (from the past 24 hour(s))   CBC with Auto Differential -STAT    Collection Time: 04/28/14  1:58 PM   Result Value Ref Range    WBC 7.3 4.8 - 10.8 K/microL    RBC 3.96 (L) 4.20 - 5.40 M/microL    Hgb 12.2 12.0 - 16.0 gm/dL    HCT 82.9 56.2 - 13.0 %    MCV 93.5 81 - 99 fL    MCH 30.7 27 - 34 pg    MCHC 32.9 32 - 36 gm/dL    RDW 86.5 78.4 - 69.6 %    Platelet Count 345 150 - 400 K/microL    MPV 7.1 (L) 7.4 - 10.4 fL    Differential Type AUTO     Neutrophils % 63 35 - 70 %    Lymphocytes % 25 25 - 45 %    Monocytes % 7 0 - 12 %    Eosinophils % 4 0 - 7 %    Basophils % 1 0 - 2 %    Neutrophils, Absolute 4.6 1.6 - 7.3 K/microL    Lymphocytes, Absolute 1.8 1.1 - 4.3 K/microL    Monocytes, Absolute 0.5 0 - 1.2 K/microL    Eosinophils, Absolute 0.3 0 - 0.7 K/microL    Basophils, Absolute 0.1 0 - 0.2 K/microL   Comprehensive Metabolic Panel -STAT    Collection Time: 04/28/14  1:58 PM   Result Value Ref Range    Sodium 139 135 - 143 mmol/L    Potassium 3.6 3.5 - 5.1 mmol/L    Chloride 106 98 - 111 mmol/L    CO2 - Carbon Dioxide 26.0 22 - 31 mmol/L    Glucose 97 80 - 99  mg/dL    BUN 6 (L) 8 - 21 mg/dL    Creatinine, Serum 0.86 0.44 - 1.03 mg/dL    Calcium 8.7 8.6 - 57.8 mg/dL    AST - Aspartate Aminotransferase 22 10 - 42 IU/L    ALT - Alanine Amino transferase 15 10 - 45 IU/L    Alkaline Phosphatase 80 37 - 107 IU/L    Bilirubin, Total 0.4 0.2 - 1.4 mg/dL    Protein, Total 5.8 (L) 6.1 - 7.9 gm/dL    Albumin 3.3 (L) 3.5 - 5.0 gm/dL    Globulin 2.5 2.2 - 3.7 gm/dL    Albumin/Globulin Ratio 1.3 >0.9    Anion Gap 7.0 3 - 11 mmol/L    GFR Estimate >60 >60 mL/min/1.73sq.m    GFR  Additional Info       For African Americans, multiply EGFR x 1.21.  See [email or web address]   Lactic Acid (ED) -STAT    Collection Time: 04/28/14  1:58 PM   Result Value Ref Range    Lactic Acid 1.6 0.5 - 2.2 mmol/L   Creatinine ED -STAT    Collection Time: 04/28/14  1:58 PM   Result Value Ref Range    Creatinine Ed Sample 0.77 0.64 - 1.03 mg/dL    GFR Estimate >46 >96 mL/min/1.73sq.m    GFR Additional Info       For African Americans, multiply EGFR x 1.21.  See [email or web address]   Lipase -STAT    Collection Time: 04/28/14  1:58 PM   Result Value Ref Range    Lipase 13 (L) 15 - 50 U/L   Urinalysis, Routine -STAT    Collection Time: 04/28/14  2:22 PM   Result Value Ref Range    Urine Color YELLOW     Urine Character CLOUDY     Glucose, urine NEGATIVE NEGATIVE mg/dl    Bilirubin, urine NEGATIVE NEGATIVE    Ketones, urine NEGATIVE NEGATIVE    Specific Gravity, urine 1.011 1.003 - 1.030    Blood, urine SMALL (A) NEGATIVE    pH, UA 7.0 5.0 - 8.5    Protein, urine NEGATIVE NEGATIVE    Urobilinogen, urine NEGATIVE NEGATIVE EU/dL    Nitrite, urine NEGATIVE NEGATIVE    Leukocyte Esterase, urine NEGATIVE NEGATIVE    WBC, UA 5 0 - 9 /HPF    Squamous Epithelial, UA 7 (H) 0 - 5 /HPF    Bacteria, UA 190 (H) 0 - 92 /HPF    RBC, UA 6 (H) 0 - 5 /HPF    Epithelial, Other 0 TO 1 0 /HPF    Hyaline Casts, UA 10 (H) 0 - 5 /LPF    Pathological Casts NONE SEEN NONE SEEN /LPF    Urinalysis Microscopic MANUAL MICROSCOPIC        RADIOLOGY/PROCEDURES  CT abdomen pelvis with IV contrast   Final Result         CT ABDOMEN PELVIS W IV CONTRAST   04/28/2014 15:15:15      PROVIDED HISTORY: LLQ pain       ADDITIONAL HISTORY: 48 year old      COMPARISONS: None.      TECHNIQUE: Following administration of intravenous contrast material,    helical CT imaging is performed from the domes of the diaphragm to the    symphysis pubis, during the parenchymal phase of contrast enhancement.     Axial images are submitted as well as    sagittal and coronal  2-dimensional reformations.  FINDINGS: Lung bases appear free of consolidation or nodularity.  No    pericardial or pleural effusion.  The liver shows diffuse fatty    infiltration.  Gallbladder surgically absent.  Normal appearance to    pancreas.  Common bile duct at level of head of    pancreas measures under 1 cm in size.  The spleen shows homogeneous    attenuation.  Normal appearance to the adrenal glands.  Both kidneys show    normal reniform shape of both demonstrate normal excretion of contrast    material.  There is several skin    granuloma is within the posterior soft tissues at level of kidneys.     Pericecal fat planes are intact.  Small bowel loops show normal caliber.     The uterus is either surgically absent or atrophic.  No adnexal mass or    free fluid.  Schmorl's nodes are seen    involving the T11 vertebral body.  Lumbar vertebral bodies demonstrate    normal appearance.      IMPRESSION: Hepatic steatosis, surgical absence of gallbladder.   Negative CT examination otherwise for acute inflammation.           ED COURSE: The patient's vital signs were promptly assessed and were stable.  History significant for nausea with vomiting, and left lower abdominal discomfort that has been present for the last several days which significantly worsened over the last 24-48 hours. On review the medical record she previously was diagnosed with possible peptic ulcer disease and had a negative CT of the abdomen and pelvis at that time aside from diverticular disease noted.  Physical Exam demonstrates left lower abdominal discomfort on palpation with no rebound or guarding concerning for acute abdomen as well as mild epigastric discomfort. Laboratory studies were obtained and demonstrate no evidence of pancreatitis, normal liver function studies, no elevated white blood cell count, normal urinalysis with a slight amount of increased bacteria in the urine however negative nitrites and leukocyte esterase.  Renal function is unremarkable. Patient was given Zofran with significant improvement in her nausea. She is noted to tolerate sips of water without difficulty. Due to her significant increase in pain we did repeat the CT of the abdomen and pelvis to ensure that she did not develop diverticulitis or perforation of gastric ulcer. CT demonstrates no acute abdomen abilities at this time. We discussed that her nausea and vomiting as well as epigastric discomfort are still likely related to peptic ulcer disease. We would like to increase her proton except this time to 40 mg for better control of her symptoms as well as a short course of Zofran for her nausea. In regards to her lower abdominal discomfort is likely this is secondary to abdominal wall strain due to her vomiting. The patient and her husband are comfortable with use of medications to control her acid production as well as Zofran for nausea management. She has no chest pain or discomfort that would be concerning for cardiac etiology at this time and limited risk factors for cardiac disease. Patient will follow up with PCP as needed or return to the [facility] immediately with any worsening symptoms.  Patient expressed understanding of all of the instructions provided   ?    CLINICAL IMPRESSION :       SNOMED CT(R)   1. Acute gastric ulcer  ACUTE GASTRIC ULCER   2. Abdominal wall strain, initial encounter  STRAIN OF ABDOMINAL MUSCLE   3. Acute cystitis with hematuria  ACUTE HEMORRHAGIC CYSTITIS       PLAN:    As per ED course    Eleonore Chiquito, MD              **This document was created with the assistance of voice-to-text technology. Effort has been made to minimize transcription errors. Please allow for homonyms and other similar transcription errors, such as she in place of he and vice versa.Renda Rolls, MD  04/28/14 2102

## 2014-04-28 NOTE — ED Notes (Signed)
Pt was here 6 days ago with abd pain, vomiting. Pt feels she has worsened. Cannot eat, now feet and legs swelling and headache.

## 2014-04-29 MED ORDER — morphine injection 4 mg
4 | Freq: Once | INTRAVENOUS | Status: AC
Start: 2014-04-29 — End: 2014-04-28
  Administered 2014-04-29: 01:00:00 4 mg via INTRAVENOUS

## 2014-04-29 MED ORDER — ondansetron (ZOFRAN-ODT) 8 MG disintegrating tablet
8 | ORAL_TABLET | Freq: Three times a day (TID) | ORAL | Status: AC | PRN
Start: 2014-04-29 — End: 2014-05-05

## 2014-04-29 MED ORDER — sucralfate (CARAFATE) 1 gram tablet
1 | ORAL_TABLET | Freq: Four times a day (QID) | ORAL | 0.00 refills | 30.00000 days | Status: AC
Start: 2014-04-29 — End: 2014-05-28

## 2014-04-29 MED ORDER — ondansetron (ZOFRAN) injection 4 mg
4 | Freq: Once | INTRAMUSCULAR | Status: AC
Start: 2014-04-29 — End: 2014-04-28
  Administered 2014-04-29: 01:00:00 4 mg via INTRAVENOUS

## 2014-04-29 MED ORDER — pantoprazole (PROTONIX) 20 MG tablet
20 | ORAL_TABLET | Freq: Every day | ORAL | 0.00 refills | 45.00000 days | Status: AC
Start: 2014-04-29 — End: 2014-05-28

## 2014-04-29 MED FILL — MORPHINE 4 MG/ML INTRAVENOUS CARTRIDGE: 4 mg/mL | INTRAVENOUS | Qty: 1

## 2014-04-29 MED FILL — ONDANSETRON HCL (PF) 4 MG/2 ML INJECTION SOLUTION: 4 | INTRAMUSCULAR | Qty: 2

## 2014-07-05 ENCOUNTER — Emergency Department: Admit: 2014-07-05 | Payer: PRIVATE HEALTH INSURANCE

## 2014-07-05 ENCOUNTER — Inpatient Hospital Stay
Admit: 2014-07-05 | Discharge: 2014-07-05 | Disposition: A | Discharge status: Home / Self Care | Payer: PRIVATE HEALTH INSURANCE

## 2014-07-05 DIAGNOSIS — R1110 Vomiting, unspecified: Secondary | ICD-10-CM

## 2014-07-05 LAB — URINALYSIS, ROUTINE
Bacteria, UA: 146 /HPF — ABNORMAL HIGH (ref 0–92)
Bilirubin, urine: NEGATIVE
Glucose, UA: NEGATIVE mg/dl
Hyaline Casts, UA: 9 /LPF — ABNORMAL HIGH (ref 0–5)
Ketones, urine: NEGATIVE
Leukocyte Esterase, urine: NEGATIVE
Nitrite, urine: NEGATIVE
RBC, UA: 12 /HPF — ABNORMAL HIGH (ref 0–5)
Specific Gravity, urine: 1.018 (ref 1.003–1.030)
Squamous Epithelial, UA: 4 /HPF (ref 0–5)
Urobilinogen, urine: NEGATIVE EU/dL
WBC, UA: 1 /HPF (ref 0–9)
pH, UA: 6 (ref 5.0–8.5)

## 2014-07-05 LAB — CBC WITH AUTO DIFFERENTIAL
Basophils %: 0 % (ref 0–2)
Basophils, Absolute: 0.1 10*3/uL (ref 0–0.2)
Eosinophils %: 2 % (ref 0–7)
Eosinophils, Absolute: 0.3 10*3/uL (ref 0–0.7)
HCT: 44.9 % (ref 37.0–48.0)
Hgb: 14.2 gm/dL (ref 12.0–16.0)
Lymphocytes %: 4 % — ABNORMAL LOW (ref 25–45)
Lymphocytes, Absolute: 0.8 10*3/uL — ABNORMAL LOW (ref 1.1–4.3)
MCH: 30.1 pg (ref 27–34)
MCHC: 31.7 gm/dL — ABNORMAL LOW (ref 32–36)
MCV: 95.1 fL (ref 81–99)
MPV: 8 fL (ref 7.4–10.4)
Monocytes %: 4 % (ref 0–12)
Monocytes, Absolute: 0.8 10*3/uL (ref 0–1.2)
Neutrophils, Absolute: 16.9 10*3/uL — ABNORMAL HIGH (ref 1.6–7.3)
Platelet Count: 376 10*3/uL (ref 150–400)
RBC: 4.72 10*6/uL (ref 4.20–5.40)
RDW: 13.7 % (ref 11.5–14.5)
Segs (Neutrophils)%: 90 % — ABNORMAL HIGH (ref 35–70)
WBC: 18.9 10*3/uL — ABNORMAL HIGH (ref 4.8–10.8)

## 2014-07-05 LAB — COMPREHENSIVE METABOLIC PANEL
ALT - Alanine Amino transferase: 13 IU/L (ref 7–52)
AST - Aspartate Aminotransferase: 15 IU/L (ref 8–39)
Albumin/Globulin Ratio: 1.5 (ref >0.9)
Albumin: 4.4 gm/dL (ref 3.5–5.0)
Alkaline Phosphatase: 110 IU/L — ABNORMAL HIGH (ref 34–104)
Anion Gap: 10.6 mmol/L (ref 3–11)
BUN: 10 mg/dL (ref 6.0–23.0)
Bilirubin, Total: 0.5 mg/dL (ref 0.3–1.2)
CO2 - Carbon Dioxide: 21.4 mmol/L (ref 21.0–31.0)
Calcium: 9.5 mg/dL (ref 8.6–10.0)
Chloride: 106 mmol/L (ref 98–111)
Creatinine, Serum: 1 mg/dL (ref 0.55–1.10)
GFR Estimate: 59 mL/min/{1.73_m2} — ABNORMAL LOW (ref >60)
Globulin: 2.9 gm/dL (ref 2.2–3.7)
Glucose: 163 mg/dL — ABNORMAL HIGH (ref 80–99)
Potassium: 3.7 mmol/L (ref 3.5–5.1)
Protein, Total: 7.3 gm/dL (ref 6.0–8.0)
Sodium: 138 mmol/L (ref 135–143)

## 2014-07-05 LAB — TROPONIN I: Troponin I: <0.03 ng/mL (ref 0.00–0.04)

## 2014-07-05 LAB — LIPASE: Lipase: 14.8 U/L (ref 10–60)

## 2014-07-05 MED ORDER — sodium chloride 0.9 % (NS) syringe 10 mL
INTRAMUSCULAR | Status: DC | PRN
Start: 2014-07-05 — End: 2014-07-05

## 2014-07-05 MED ORDER — ondansetron (ZOFRAN ODT) 8 MG disintegrating tablet
8 | ORAL_TABLET | Freq: Three times a day (TID) | ORAL | Status: AC | PRN
Start: 2014-07-05 — End: ?

## 2014-07-05 MED ORDER — morphine (PF) injection 6 mg
10 | INTRAVENOUS | Status: DC | PRN
Start: 2014-07-05 — End: 2014-07-05
  Administered 2014-07-05: 11:00:00 10 mg via INTRAVENOUS

## 2014-07-05 MED ORDER — LORazepam (ATIVAN) syringe 1 mg
2 | Freq: Once | INTRAMUSCULAR | Status: AC
Start: 2014-07-05 — End: 2014-07-05
  Administered 2014-07-05: 10:00:00 2 mg via INTRAVENOUS

## 2014-07-05 MED ORDER — HYDROmorphone (DILAUDID) syringe 1 mg
1 | INTRAMUSCULAR | Status: DC | PRN
Start: 2014-07-05 — End: 2014-07-05

## 2014-07-05 MED ORDER — ondansetron (ZOFRAN) injection 4 mg
4 | INTRAMUSCULAR | Status: AC | PRN
Start: 2014-07-05 — End: 2014-07-05
  Administered 2014-07-05 (×2): 4 mg via INTRAVENOUS

## 2014-07-05 MED ORDER — HYDROmorphone (DILAUDID) syringe 1 mg
1 | INTRAMUSCULAR | Status: AC | PRN
Start: 2014-07-05 — End: 2014-07-05
  Administered 2014-07-05 (×2): 1 mg via INTRAVENOUS

## 2014-07-05 MED ORDER — sodium chloride (NS) 0.9% bolus 2,000 mL
Freq: Once | INTRAVENOUS | Status: AC
Start: 2014-07-05 — End: 2014-07-05
  Administered 2014-07-05 (×2): via INTRAVENOUS

## 2014-07-05 MED ORDER — prochlorperazine (COMPAZINE) injection 10 mg
10 | Freq: Once | INTRAMUSCULAR | Status: AC
Start: 2014-07-05 — End: 2014-07-05
  Administered 2014-07-05: 11:00:00 10 mg via INTRAVENOUS

## 2014-07-05 MED FILL — PROCHLORPERAZINE EDISYLATE 10 MG/2 ML (5 MG/ML) INJECTION SOLUTION: 10 mg/2 mL (5 mg/mL) | INTRAMUSCULAR | Qty: 2

## 2014-07-05 MED FILL — LORAZEPAM 2 MG/ML INJECTION SYRINGE: 2 mg/mL | INTRAMUSCULAR | Qty: 1

## 2014-07-05 MED FILL — ONDANSETRON HCL (PF) 4 MG/2 ML INJECTION SOLUTION: 4 | INTRAMUSCULAR | Qty: 2

## 2014-07-05 MED FILL — MORPHINE 10 MG/ML INTRAVENOUS CARTRIDGE: 10 mg/mL | INTRAVENOUS | Qty: 1

## 2014-07-05 MED FILL — HYDROMORPHONE (PF) 1 MG/ML INJECTION SYRINGE: 1 mg/mL | INTRAMUSCULAR | Qty: 1

## 2014-07-05 NOTE — ED Notes (Signed)
Pt ambulate to RR without assistance. NAD.  RN on standby assist.

## 2014-07-05 NOTE — ED Notes (Signed)
Pt amb to BR without difficulties, UA sent to lab.

## 2014-07-05 NOTE — ED Notes (Signed)
Pt d/c to home in stable condition, accompanied by husband. Provided instructions, rx for zofran, verbalized understanding, denies any questions or needs at this time.

## 2014-07-05 NOTE — ED Notes (Signed)
Pt resting, NAD at this time, falls asleep though arouses easily.

## 2014-07-05 NOTE — ED Provider Notes (Signed)
[facility] Emergency Department Encounter      Chief Complaint   Patient presents with   . Abdominal Pain   . Nausea   . Emesis   . Chest Pain   . Jaw Pain       HPI:  Diana Fuller is a 48 y.o. female with a history of duodenal ulcer, pancreatitis and migraine headaches who presents to the emergency department by personal vehicle for evaluation of abdominal pain, chest pain and vomiting. Patient has had this issue for at least 2 years according to the husband. He states that she has been treated for her stomach issues by her primary doctor. The husband states that the patient's daughter is currently hospitalized in the behavioral health unit at this time and is causing quite a lot of stress which he feels like contributes significantly to her symptoms. There have been no fevers. Husband states she did pass out once today. She has also had episodes of nausea and emesis. It is described as nonbloody and nonbilious. Chest pain started today around 6:00 PM. It is nonexertional, left-sided and sharp. She also states she has a headache. There is no neck stiffness.    History obtained from chart review and the patient.      Past Medical History   Diagnosis Date   . Migraine    . Bipolar affective    . Duodenal ulcer    . Ulcerative colitis    . Pancreatitis    . Mass of pancreas          Past Surgical History   Procedure Laterality Date   . Cholecystectomy     . Hysterectomy     . Cholecystectomy     . Knee surgery     . Bunionectomy     . Tonsillectomy         Family History:  family history includes Kidney disease in her father; Seizures in her daughter.    Social History:  The patient  reports that she has quit smoking. She does not have any smokeless tobacco history on file. She reports that she does not drink alcohol or use illicit drugs.    Allergies:  No Known Allergies    Home Medications:  Previous Medications    ALBUTEROL SULFATE (PROAIR HFA INHL)    Inhale into the lungs as needed.    AMLODIPINE (NORVASC)  5 MG TABLET    Take 5 mg by mouth every morning.    ASPIRIN-ACETAMINOPHEN-CAFFEINE (EXCEDRIN EXTRA STRENGTH) 250-250-65 MG PER TABLET    Take 1 tablet by mouth every 6 (six) hours as needed.    ESTRADIOL (ESTRACE) 1 MG TABLET    Take 1 mg by mouth every morning.    FLUOXETINE (PROZAC) 20 MG TABLET    Take 20 mg by mouth every morning.     LITHIUM 300 MG CAPSULE    Take 300 mg by mouth 3 (three) times daily with meals.    PANTOPRAZOLE (PROTONIX) 40 MG TABLET    Take 40 mg by mouth every morning.        ROS:  Please see the history of present illness and nurse's notes for pertinent positives and negatives. The remainder of a 10 point review of systems is either negative or not pertinent.      Physical Exam:       Initial Vitals   BP 07/05/14 0106 139/92 mmHg   Heart Rate 07/05/14 0106 107   Resp 07/05/14 0106 20   Temp  07/05/14 0106 36.3 ?C (97.3 ?F)   SpO2 07/05/14 0106 98 %     Pulse oxymetry reading is 98% on room air.    GEN:  In general the patient is alert and tearful who appears stated age, and is in no acute distress.  HEENT: Pupils are equal, round and reactive to light. Extraocular muscles are intact. There is no scleral icterus or injection. Mucus membranes are moist.  NECK: Supple and non-tender. No jugular venous distention appreciated.   CV: Regular rate and rhythm. No murmurs or other abnormalities appreciated.  RESP: Lungs clear on auscultation bilaterally.  The patient is breathing comfortably and speaking in full sentences.  ABD: Soft and non-distended with normal bowel sounds. Mild tenderness to left lower quadrant. No rebound tenderness. No Peyton sign.  EXT: Warm and well-perfused without significant edema.  SKIN: Warm and dry.   BACK: Non-tender.No CVA tenderness  NEURO: Alert and oriented x 3. CN 2-12 are grossly intact. Moving all extremities purposefully.       Differential Diagnosis:  Pancreatitis, duodenitis, gastritis, diverticulitis, Mallory-Weiss, cardiac chest pain, stress, GERD    ED  Course:   The patient arrived by private car and is accompanied by husband. The patient was triaged to room 104. An IV was placed, labs were drawn and an history and physical exam were completed.  The patient was maintained on appropriate monitoring and was treated with IV fluids, Zofran and Dilaudid.      Results:     Labs:  Results for orders placed or performed during the hospital encounter of 07/05/14 (from the past 8 hour(s))   CBC with Auto Differential -STAT    Collection Time: 07/05/14  1:14 AM   Result Value Ref Range    WBC 18.9 (H) 4.8 - 10.8 K/microL    RBC 4.72 4.20 - 5.40 M/microL    Hgb 14.2 12.0 - 16.0 gm/dL    HCT 16.1 09.6 - 04.5 %    MCV 95.1 81 - 99 fL    MCH 30.1 27 - 34 pg    MCHC 31.7 (L) 32 - 36 gm/dL    RDW 40.9 81.1 - 91.4 %    Platelet Count 376 150 - 400 K/microL    MPV 8.0 7.4 - 10.4 fL    Differential Type AUTO     Neutrophils % 90 (H) 35 - 70 %    Lymphocytes % 4 (L) 25 - 45 %    Monocytes % 4 0 - 12 %    Eosinophils % 2 0 - 7 %    Basophils % 0 0 - 2 %    Neutrophils, Absolute 16.9 (H) 1.6 - 7.3 K/microL    Lymphocytes, Absolute 0.8 (L) 1.1 - 4.3 K/microL    Monocytes, Absolute 0.8 0 - 1.2 K/microL    Eosinophils, Absolute 0.3 0 - 0.7 K/microL    Basophils, Absolute 0.1 0 - 0.2 K/microL   Comprehensive Metabolic Panel -STAT    Collection Time: 07/05/14  1:14 AM   Result Value Ref Range    Sodium 138 135 - 143 mmol/L    Potassium 3.7 3.5 - 5.1 mmol/L    Chloride 106 98 - 111 mmol/L    CO2 - Carbon Dioxide 21.4 21.0 - 31.0 mmol/L    Glucose 163 (H) 80 - 99 mg/dL    BUN 10 6.0 - 78.2 mg/dL    Creatinine, Serum 9.56 0.55 - 1.10 mg/dL    Calcium 9.5 8.6 - 21.3 mg/dL  AST - Aspartate Aminotransferase 15 8 - 39 IU/L    ALT - Alanine Amino transferase 13 7 - 52 IU/L    Alkaline Phosphatase 110 (H) 34 - 104 IU/L    Bilirubin, Total 0.5 0.3 - 1.2 mg/dL    Protein, Total 7.3 6.0 - 8.0 gm/dL    Albumin 4.4 3.5 - 5.0 gm/dL    Globulin 2.9 2.2 - 3.7 gm/dL    Albumin/Globulin Ratio 1.5 >0.9     Anion Gap 10.6 3 - 11 mmol/L    GFR Estimate 59 (L) >60 mL/min/1.73sq.m    GFR Additional Info       For African Americans, multiply EGFR x 1.21.  See [email or web address]   Lipase -STAT    Collection Time: 07/05/14  1:14 AM   Result Value Ref Range    Lipase 14.8 10 - 60 U/L   Troponin-I -STAT    Collection Time: 07/05/14  1:17 AM   Result Value Ref Range    Troponin I <0.03 0.00 - 0.04 ng/mL   Urinalysis, Routine -STAT    Collection Time: 07/05/14  1:56 AM   Result Value Ref Range    Urine Color YELLOW     Urine Character CLEAR     Glucose, urine NEGATIVE NEGATIVE mg/dl    Bilirubin, urine NEGATIVE NEGATIVE    Ketones, urine NEGATIVE NEGATIVE    Specific Gravity, urine 1.018 1.003 - 1.030    Blood, urine MODERATE (A) NEGATIVE    pH, UA 6.0 5.0 - 8.5    Protein, urine 1+ (A) NEGATIVE    Urobilinogen, urine NEGATIVE NEGATIVE EU/dL    Nitrite, urine NEGATIVE NEGATIVE    Leukocyte Esterase, urine NEGATIVE NEGATIVE    WBC, UA 1 0 - 9 /HPF    Squamous Epithelial, UA 4 0 - 5 /HPF    Bacteria, UA 146 (H) 0 - 92 /HPF    RBC, UA 12 (H) 0 - 5 /HPF    Hyaline Casts, UA 9 (H) 0 - 5 /LPF       Radiology:  No orders to display    overnight read: No acute cardiopulmonary disease, no pneumomediastinum noted    ECG:   An ECG was contemporaneously evaluated and shows sinus tachycardia. There is slight ST segment depression in the leads V3 through V5 but this is less than 1 mm.      Medical Decision Making:     This is a 48 year old female who presents with multiple complaints of headache, chest pain, vomiting and abdominal pain. Husband states that she gets episodes like this when he is going to times of stress area and currently daughter is hospitalized for behavioral health issues. She has been evaluated here previously for similar complaints most recently in July of this year. We'll do a complete workup including lab work, EKG, chest x-ray and urinalysis. Her abdominal exam is fairly unremarkable with some mild tenderness but when  she was distracted talking to the nurse I was able to perform an abdominal exam that was unable to elicit any apparent tenderness.    In reviewing the patient's care everywhere history at [facility]. Her primary care provider has been working with her for this very issue for quite some time and that none of these symptoms are fairly new for her. We will likely perform screening labs but I doubt an emergent cause of her issue at this time due to the chronic nature and clear eliciting factor of her daughter being hospitalized for behavioral  issues.    On reevaluation the patient had received 2 mg of Dilaudid ?2, Ativan and Zofran, morphine and Compazine. Laboratory shows hematuria which is chronic and stable and she has had cystoscopy previously. Leukocytosis is also present which may be secondary to de-margination from vomiting. She was not having emesis in the emergency department although she would ambulate to the bathroom and state that she vomited in there. She was sleeping comfortably and would awaken stating she was still in 9/10 pain. We discussed possible CT scanning as well as admission to the hospital and she refused both and stated she was feeling okay enough to go home. Her abdominal exam is quite benign at this point. I feel that there is a component of her abdominal pain that is psychosocial in nature however I discussed with the patient the possibility of a more severe cause which she feels that this is similar to her previous episodes in the past and would like to avoid further CT scanning or admission for pain control and hydration. Vital signs normal. The patient did receive IV fluids as well and has normal vital signs. She feels comfortable with discharge to will return should she have difficulty with vomiting or pain again.    Disposition:  Discharged to home    Condition:  Stable    Impression:    SNOMED CT(R)   1. Vomiting  VOMITING   2. Stress at home  FAMILY TENSION   3. Chronic  abdominal pain  CHRONIC ABDOMINAL PAIN       NOTE:  This dictation was produced using voice recognition software. Although effort has been made to minimize transcription errors, homonyms and other transcription errors may be present and may not truly reflect my intent.    Norville Haggard, MD  07/05/14 [phone]

## 2014-07-05 NOTE — Discharge Instructions (Signed)
Abdominal Pain (Nonspecific)  Your exam might not show the exact reason you have abdominal pain. Since there are many different causes of abdominal pain, another checkup and more tests may be needed. It is very important to follow up for lasting (persistent) or worsening symptoms. A possible cause of abdominal pain in any person who still has his or her appendix is acute appendicitis. Appendicitis is often hard to diagnose. Normal blood tests, urine tests, ultrasound, and CT scans do not completely rule out early appendicitis or other causes of abdominal pain. Sometimes, only the changes that happen over time will allow appendicitis and other causes of abdominal pain to be determined. Other potential problems that may require surgery may also take time to become more apparent. Because of this, it is important that you follow all of the instructions below.  HOME CARE INSTRUCTIONS    Rest as much as possible.   Do not eat solid food until your pain is gone.   While adults or children have pain: A diet of water, weak decaffeinated tea, broth or bouillon, gelatin, oral rehydration solutions (ORS), frozen ice pops, or ice chips may be helpful.   When pain is gone in adults or children: Start a light diet (dry toast, crackers, applesauce, or white rice). Increase the diet slowly as long as it does not bother you. Eat no dairy products (including cheese and eggs) and no spicy, fatty, fried, or high-fiber foods.   Use no alcohol, caffeine, or cigarettes.   Take your regular medicines unless your caregiver told you not to.   Take any prescribed medicine as directed.   Only take over-the-counter or prescription medicines for pain, discomfort, or fever as directed by your caregiver. Do not give aspirin to children.  If your caregiver has given you a follow-up appointment, it is very important to keep that appointment. Not keeping the appointment could result in a permanent injury and/or lasting (chronic) pain  and/or disability. If there is any problem keeping the appointment, you must call to reschedule.   SEEK IMMEDIATE MEDICAL CARE IF:    Your pain is not gone in 24 hours.   Your pain becomes worse, changes location, or feels different.   You or your child has an oral temperature above 102 F (38.9 C), not controlled by medicine.   Your baby is older than 3 months with a rectal temperature of 102 F (38.9 C) or higher.   Your baby is 3 months old or younger with a rectal temperature of 100.4 F (38 C) or higher.   You have shaking chills.   You keep throwing up (vomiting) or cannot drink liquids.   There is blood in your vomit or you see blood in your bowel movements.   Your bowel movements become dark or black.   You have frequent bowel movements.   Your bowel movements stop (become blocked) or you cannot pass gas.   You have bloody, frequent, or painful urination.   You have yellow discoloration in the skin or whites of the eyes.   Your stomach becomes bloated or bigger.   You have dizziness or fainting.   You have chest or back pain.  MAKE SURE YOU:    Understand these instructions.   Will watch your condition.   Will get help right away if you are not doing well or get worse.  Document Released: 09/18/2005 Document Revised: 12/11/2011 Document Reviewed: 08/16/2009  [employer] Patient Information 2014 [employer].

## 2014-07-05 NOTE — ED Notes (Signed)
Pt sleeping, snoring and NAD noted. Arouses easily and rates pain 9/10.

## 2014-07-05 NOTE — ED Notes (Signed)
Pt resting, states pain and nausea are slightly better. Placed on NC@2L  for sats decreasing to 90% on RA.

## 2014-07-05 NOTE — ED Notes (Signed)
Dr. Joseph Berkshire in with pt, discussed options for meds and plan of care.

## 2014-07-05 NOTE — ED Notes (Addendum)
Pt arrives to ED accompanied by husband with c/o CP, persistent vomiting, falls/fainting, weakness, abd pain, HA that started around 18:30 this evening. Pt has hx of CP and nausea for past few weeks, increased stress which she describes as significant (daughter in 2N). Transfers from w/c to gurney with standby assist, placed in gown, on cardiac monitor, IV established, Dr. Ortencia Kick at bedside for eval.  Pt describes CP as aching and radiates to jaw and neck, 9/10. Nausea and vomiting persistent since 18:30, denies any blood in vomit. Abd pain to LLQ, non-tender to palp. HA described as throbbing and because I am so weak and keep vomiting. Husband states her falls have been after the vomiting and assisted by him to the ground, has not hit head or anything with falls and NO LOC. Pt has been too weak to get up, husband brought pt to ED shortly after. Pt wretching at this time, call light in reach, labs to be sent and awaiting orders for meds.

## 2014-07-07 ENCOUNTER — Inpatient Hospital Stay: Payer: PRIVATE HEALTH INSURANCE | Attending: Gastroenterology

## 2014-07-07 DIAGNOSIS — R197 Diarrhea, unspecified: Secondary | ICD-10-CM

## 2014-07-07 LAB — WOUND CULTURE, SUPERFICIAL
Gram Stain Result: NONE SEEN
Gram Stain Result: NONE SEEN

## 2014-07-07 LAB — STOOL CULTURE

## 2014-07-30 IMAGING — CR DG CHEST 2V
1 series · 2 of 2 positions shown · non-contrast
Comparison: Chest radiograph November 20, 2011

CLINICAL DATA: Lower extremity edema.  Smoker.

EXAM:
CHEST  2 VIEW

[Series 1: x chest ap · 0.14mm/px · 2 of 2 slices shown]
[im 1/2]
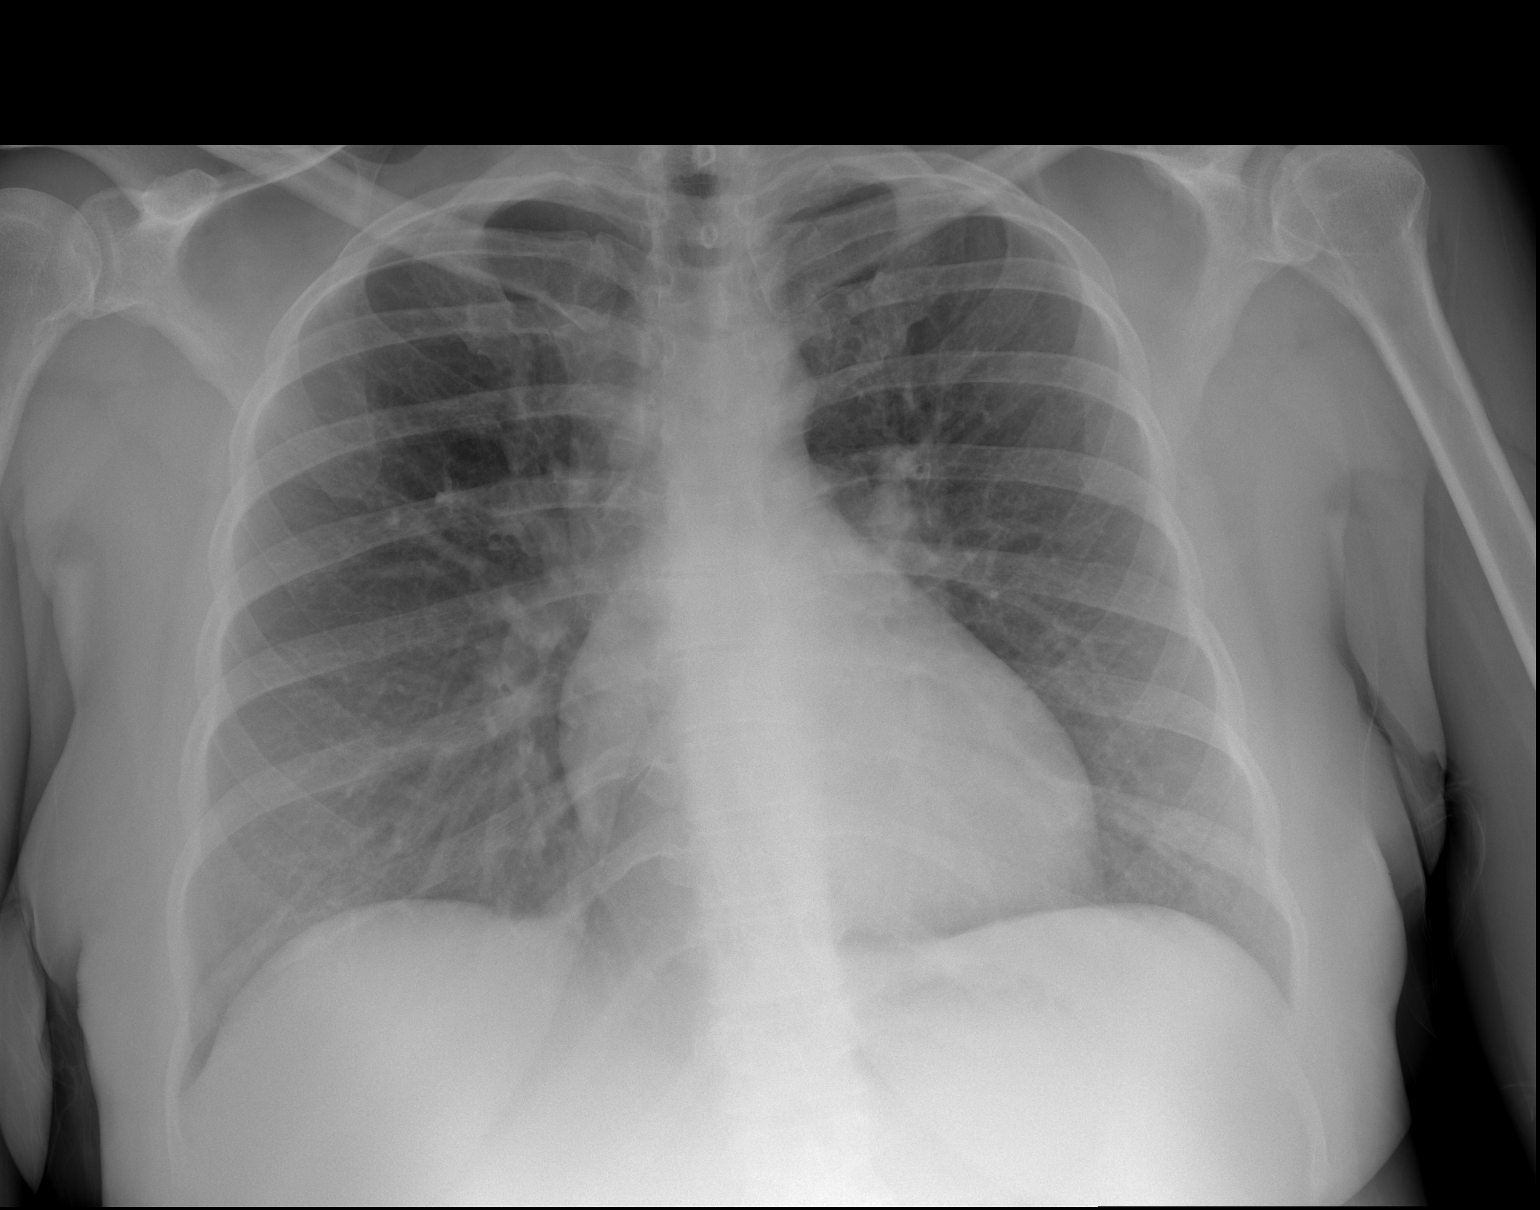
[im 2/2]
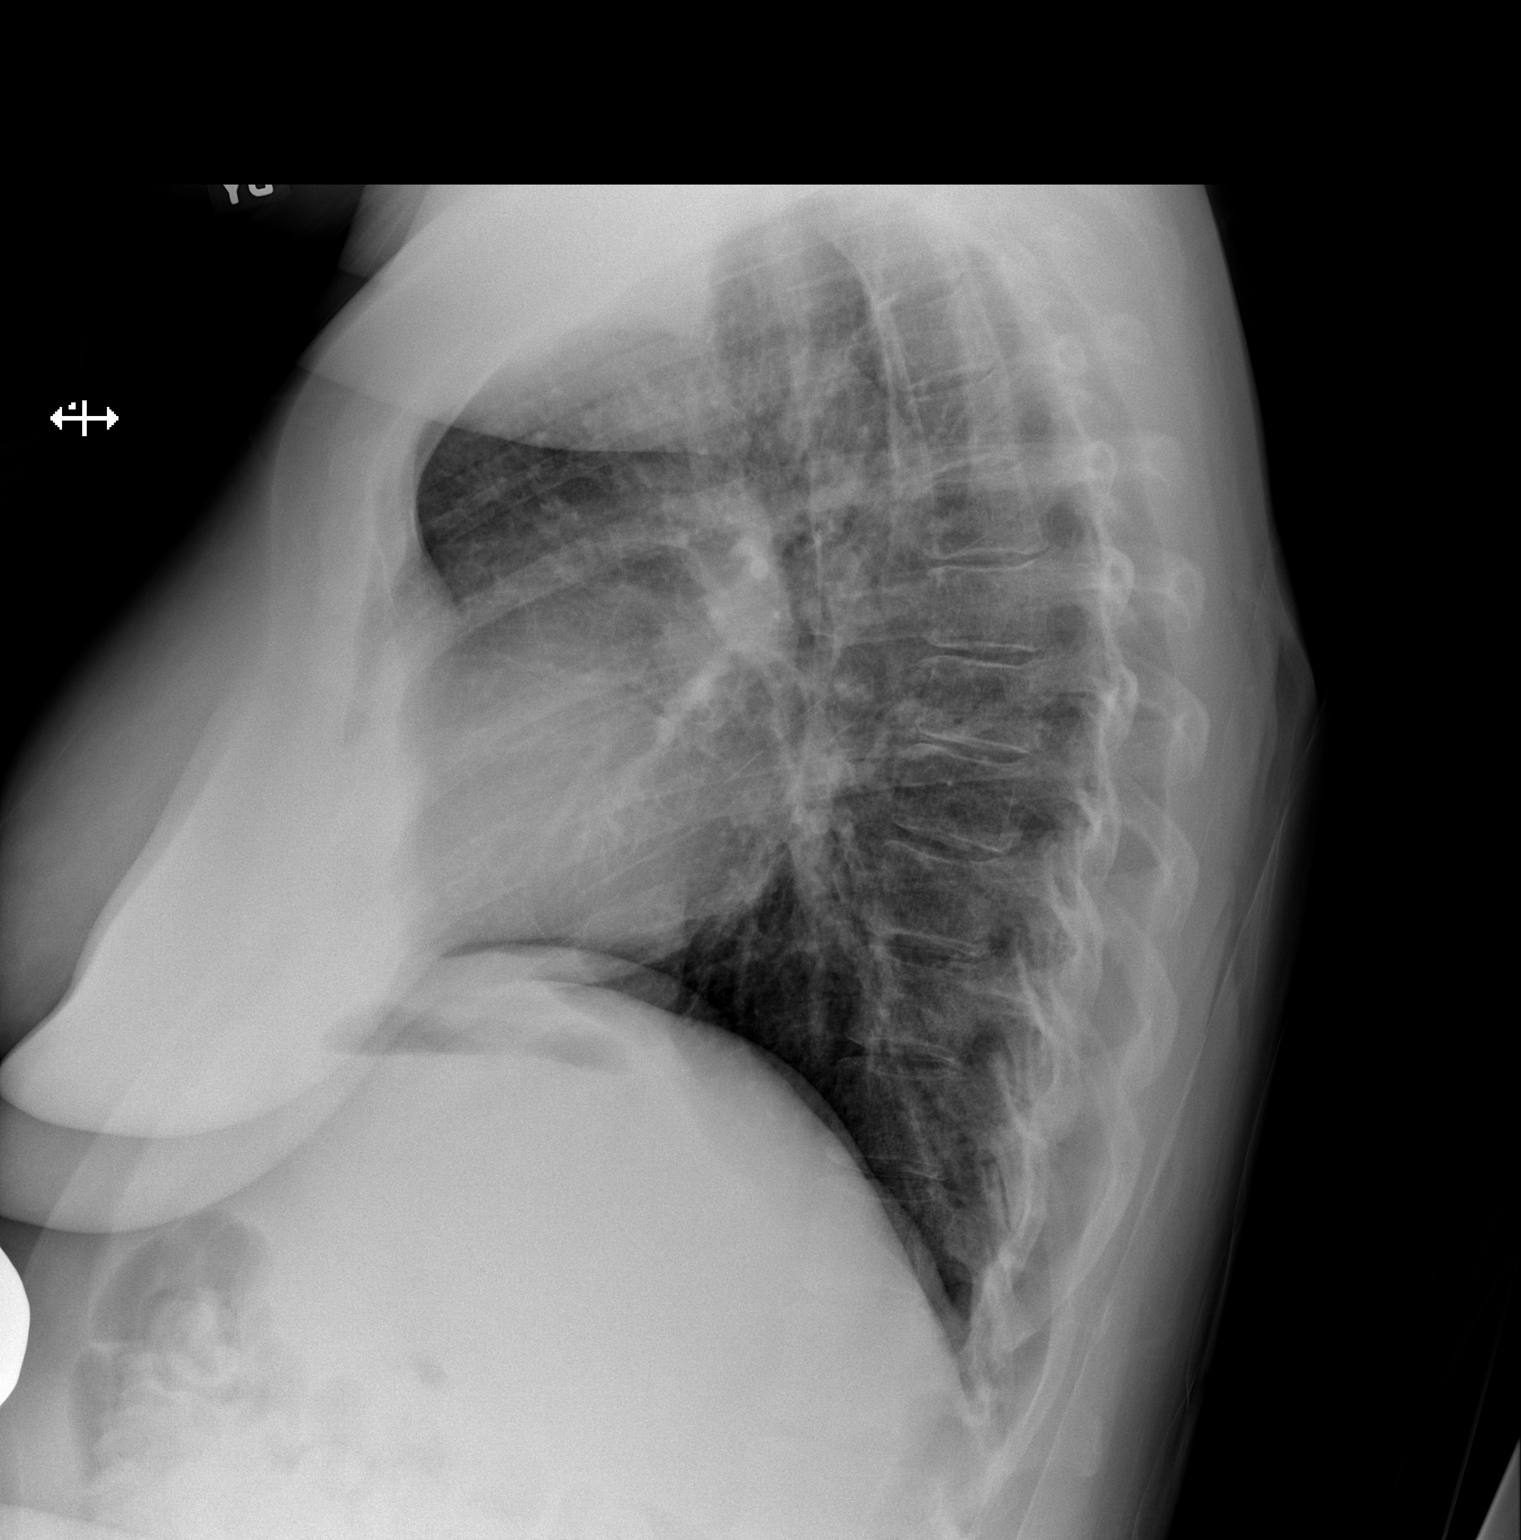

[2 of 2 positions shown; findings below may reference images not displayed]

FINDINGS: Cardiomediastinal silhouette is unremarkable. The lungs are clear
without pleural effusions or focal consolidations. Pulmonary
vasculature is unremarkable. Trachea projects midline and there is
no pneumothorax. Soft tissue planes and included osseous structures
are nonsuspicious.
IMPRESSION: No acute cardiopulmonary process.

  By: Tyron Reynosa

## 2015-06-01 ENCOUNTER — Encounter: Payer: Self-pay | Admitting: *Deleted

## 2015-06-01 ENCOUNTER — Other Ambulatory Visit: Payer: Self-pay

## 2015-06-01 NOTE — Patient Instructions (Signed)
  Your procedure is scheduled on: 06-03-15 Report to [place] To find out your arrival time please call [phone] between 1PM - 3PM on 06-02-15  Remember: Instructions that are not followed completely may result in serious medical risk, up to and including death, or upon the discretion of your surgeon and anesthesiologist your surgery may need to be rescheduled.    _X___ 1. Do not eat food or drink liquids after midnight. No gum chewing or hard candies.     _X___ 2. No Alcohol for 24 hours before or after surgery.   ____ 3. Bring all medications with you on the day of surgery if instructed.    ____ 4. Notify your doctor if there is any change in your medical condition     (cold, fever, infections).     Do not wear jewelry, make-up, hairpins, clips or nail polish.  Do not wear lotions, powders, or perfumes. You may wear deodorant.  Do not shave 48 hours prior to surgery. Men may shave face and neck.  Do not bring valuables to the hospital.    [facility] is not responsible for any belongings or valuables.               Contacts, dentures or bridgework may not be worn into surgery.  Leave your suitcase in the car. After surgery it may be brought to your room.  For patients admitted to the hospital, discharge time is determined by your   treatment team.   Patients discharged the day of surgery will not be allowed to drive home.   Please read over the following fact sheets that you were given:     ____ Take these medicines the morning of surgery with A SIP OF WATER:    1. NONE  2.   3.   4.  5.  6.  ____ Fleet Enema (as directed)   ____ Use CHG Soap as directed  ____ Use inhalers on the day of surgery  ____ Stop metformin 2 days prior to surgery    ____ Take 1/2 of usual insulin dose the night before surgery and none on the morning of surgery.   ____ Stop Coumadin/Plavix/aspirin-N/A  ____ Stop Anti-inflammatories-NO NSAIDS OR ASA  PRODUCTS-TYLENOL OK   ____ Stop supplements until after surgery.    ____ Bring C-Pap to the hospital.

## 2015-06-03 ENCOUNTER — Encounter
Admission: RE | Disposition: A | Discharge status: Home / Self Care | Payer: Self-pay | Source: Ambulatory Visit | Attending: Otolaryngology

## 2015-06-03 ENCOUNTER — Encounter: Payer: Self-pay | Admitting: *Deleted

## 2015-06-03 ENCOUNTER — Ambulatory Visit: Payer: Medicaid Other | Admitting: Anesthesiology

## 2015-06-03 ENCOUNTER — Ambulatory Visit
Admission: RE | Admit: 2015-06-03 | Discharge: 2015-06-03 | Disposition: A | Discharge status: Home / Self Care | Payer: Medicaid Other | Source: Ambulatory Visit | Attending: Otolaryngology | Admitting: Otolaryngology

## 2015-06-03 DIAGNOSIS — R490 Dysphonia: Secondary | ICD-10-CM | POA: Insufficient documentation

## 2015-06-03 DIAGNOSIS — F17210 Nicotine dependence, cigarettes, uncomplicated: Secondary | ICD-10-CM | POA: Insufficient documentation

## 2015-06-03 DIAGNOSIS — C329 Malignant neoplasm of larynx, unspecified: Secondary | ICD-10-CM | POA: Diagnosis not present

## 2015-06-03 HISTORY — DX: Dysphonia: R49.0

## 2015-06-03 HISTORY — PX: LARYNGOSCOPY: SHX5203

## 2015-06-03 LAB — POCT PREGNANCY, URINE: Preg Test, Ur: NEGATIVE

## 2015-06-03 SURGERY — LARYNGOSCOPY
Anesthesia: General | Site: Throat | Wound class: Clean Contaminated

## 2015-06-03 MED ORDER — LIDOCAINE HCL (PF) 4 % IJ SOLN
INTRAMUSCULAR | Status: DC | PRN
  Administered 2015-06-03: 3 mL via INTRADERMAL

## 2015-06-03 MED ORDER — LACTATED RINGERS IV SOLN
INTRAVENOUS | Status: DC
  Administered 2015-06-03 (×2): via INTRAVENOUS

## 2015-06-03 MED ORDER — FENTANYL CITRATE (PF) 100 MCG/2ML IJ SOLN
25.0000 ug | INTRAMUSCULAR | Status: DC | PRN
  Administered 2015-06-03 (×2): 50 ug via INTRAVENOUS

## 2015-06-03 MED ORDER — PROMETHAZINE HCL 25 MG/ML IJ SOLN
6.2500 mg | INTRAMUSCULAR | Status: DC | PRN
Start: 2015-06-03 — End: 2015-06-03
  Administered 2015-06-03: 12.5 mg via INTRAVENOUS

## 2015-06-03 MED ORDER — PROPOFOL 10 MG/ML IV BOLUS
INTRAVENOUS | Status: DC | PRN
  Administered 2015-06-03: 100 mg via INTRAVENOUS

## 2015-06-03 MED ORDER — FAMOTIDINE 20 MG PO TABS
ORAL_TABLET | ORAL | Status: AC
Start: 2015-06-03 — End: 2015-06-03
  Administered 2015-06-03: 20 mg via ORAL
  Filled 2015-06-03: qty 1

## 2015-06-03 MED ORDER — METHYLENE BLUE 1 % INJ SOLN
INTRAMUSCULAR | Status: AC
Start: 2015-06-03 — End: 2015-06-03
  Filled 2015-06-03: qty 10

## 2015-06-03 MED ORDER — FENTANYL CITRATE (PF) 100 MCG/2ML IJ SOLN
INTRAMUSCULAR | Status: DC
Start: 2015-06-03 — End: 2015-06-03
  Filled 2015-06-03: qty 2

## 2015-06-03 MED ORDER — PROMETHAZINE HCL 12.5 MG PO TABS: 12.5000 mg | ORAL_TABLET | Freq: Four times a day (QID) | ORAL | Status: AC | PRN

## 2015-06-03 MED ORDER — FAMOTIDINE 20 MG PO TABS
20.0000 mg | ORAL_TABLET | Freq: Once | ORAL | Status: AC
  Administered 2015-06-03: 20 mg via ORAL

## 2015-06-03 MED ORDER — HYDROCODONE-ACETAMINOPHEN 5-325 MG PO TABS: 1.0000 | ORAL_TABLET | ORAL | Status: AC | PRN

## 2015-06-03 MED ORDER — EPINEPHRINE HCL 1 MG/ML IJ SOLN
INTRAMUSCULAR | Status: DC | PRN
  Administered 2015-06-03: 1 mg

## 2015-06-03 MED ORDER — SUCCINYLCHOLINE CHLORIDE 20 MG/ML IJ SOLN
INTRAMUSCULAR | Status: DC | PRN
  Administered 2015-06-03: 100 mg via INTRAVENOUS

## 2015-06-03 MED ORDER — EPINEPHRINE HCL 1 MG/ML IJ SOLN
INTRAMUSCULAR | Status: AC
  Filled 2015-06-03: qty 1

## 2015-06-03 MED ORDER — ONDANSETRON HCL 4 MG/2ML IJ SOLN
INTRAMUSCULAR | Status: DC | PRN
  Administered 2015-06-03: 4 mg via INTRAVENOUS

## 2015-06-03 MED ORDER — DEXMEDETOMIDINE HCL 200 MCG/2ML IV SOLN
INTRAVENOUS | Status: DC | PRN
  Administered 2015-06-03: 12 ug via INTRAVENOUS

## 2015-06-03 MED ORDER — LIDOCAINE HCL (CARDIAC) 20 MG/ML IV SOLN
INTRAVENOUS | Status: DC | PRN
  Administered 2015-06-03: 100 mg via INTRAVENOUS

## 2015-06-03 MED ORDER — SUCCINYLCHOLINE CHLORIDE 200 MG/10ML IV SOSY
PREFILLED_SYRINGE | INTRAVENOUS | Status: DC | PRN
  Administered 2015-06-03: 20 mg via INTRAVENOUS
  Administered 2015-06-03: 100 mg via INTRAVENOUS

## 2015-06-03 MED ORDER — PROMETHAZINE HCL 25 MG/ML IJ SOLN
INTRAMUSCULAR | Status: AC
  Filled 2015-06-03: qty 1

## 2015-06-03 MED ORDER — DEXAMETHASONE SODIUM PHOSPHATE 4 MG/ML IJ SOLN
INTRAMUSCULAR | Status: DC | PRN
  Administered 2015-06-03: 10 mg via INTRAVENOUS

## 2015-06-03 MED ORDER — OXYMETAZOLINE HCL 0.05 % NA SOLN
NASAL | Status: AC
  Filled 2015-06-03: qty 15

## 2015-06-03 MED ORDER — FENTANYL CITRATE (PF) 100 MCG/2ML IJ SOLN
INTRAMUSCULAR | Status: DC | PRN
  Administered 2015-06-03: 100 ug via INTRAVENOUS

## 2015-06-03 MED ORDER — PREDNISONE 10 MG (21) PO TBPK: ORAL_TABLET | ORAL | Status: AC

## 2015-06-03 MED ORDER — MIDAZOLAM HCL 2 MG/2ML IJ SOLN
INTRAMUSCULAR | Status: DC | PRN
  Administered 2015-06-03: 2 mg via INTRAVENOUS

## 2015-06-03 MED ORDER — LIDOCAINE HCL (PF) 4 % IJ SOLN
INTRAMUSCULAR | Status: AC
  Filled 2015-06-03: qty 5

## 2015-06-03 SURGICAL SUPPLY — 23 items
ATOMIZER TRACHEAL (MISCELLANEOUS) ×3 IMPLANT
BANDAGE EYE OVAL (MISCELLANEOUS) ×3 IMPLANT
BASIN GRAD PLASTIC 32OZ STRL (MISCELLANEOUS) ×3 IMPLANT
CUP MEDICINE 2OZ PLAST GRAD ST (MISCELLANEOUS) ×6 IMPLANT
DRAPE TABLE BACK 80X90 (DRAPES) ×3 IMPLANT
DRESSING TELFA 4X3 1S ST N-ADH (GAUZE/BANDAGES/DRESSINGS) ×3 IMPLANT
GLOVE BIO SURGEON STRL SZ7.5 (GLOVE) ×3 IMPLANT
GOWN STRL REUS W/ TWL LRG LVL3 (GOWN DISPOSABLE) ×2 IMPLANT
GOWN STRL REUS W/TWL LRG LVL3 (GOWN DISPOSABLE) ×4
LABEL OR SOLS (LABEL) ×3 IMPLANT
MARKER SKIN W/RULER 31145785 (MISCELLANEOUS) ×3 IMPLANT
NDL SAFETY 18GX1.5 (NEEDLE) ×3 IMPLANT
NEEDLE FILTER BLUNT 18X 1/2SAF (NEEDLE) ×4
NEEDLE FILTER BLUNT 18X1 1/2 (NEEDLE) ×2 IMPLANT
PATTIES SURGICAL .5 X.5 (GAUZE/BANDAGES/DRESSINGS) ×3 IMPLANT
SOL ANTI-FOG 6CC FOG-OUT (MISCELLANEOUS) ×1 IMPLANT
SOL FOG-OUT ANTI-FOG 6CC (MISCELLANEOUS) ×2
SPONGE XRAY 4X4 16PLY STRL (MISCELLANEOUS) ×3 IMPLANT
SYR 3ML LL SCALE MARK (SYRINGE) ×6 IMPLANT
SYRINGE 10CC LL (SYRINGE) ×3 IMPLANT
TOWEL OR 17X26 4PK STRL BLUE (TOWEL DISPOSABLE) ×3 IMPLANT
TUBING CONNECTING 10 (TUBING) ×2 IMPLANT
TUBING CONNECTING 10' (TUBING) ×1

## 2015-06-03 NOTE — Discharge Instructions (Signed)
AMBULATORY SURGERY  DISCHARGE INSTRUCTIONS   1) The drugs that you were given will stay in your system until tomorrow so for the next 24 hours you should not:  A) Drive an automobile B) Make any legal decisions C) Drink any alcoholic beverage   2) You may resume regular meals tomorrow.  Today it is better to start with liquids and gradually work up to solid foods.  You may eat anything you prefer, but it is better to start with liquids, then soup and crackers, and gradually work up to solid foods.   3) Please notify your doctor immediately if you have any unusual bleeding, trouble breathing, redness and pain at the surgery site, drainage, fever, or pain not relieved by medication.    4) Additional Instructions:   Laryngoscopy A laryngoscopy is a procedure performed to view the back of the throat, vocal cords, and voice box (larynx). It may be done in order to figure out why a person has:  A cough.  Voice changes, such as new hoarseness.  Throat pain.  Problems swallowing. During a laryngoscopy, tissue samples (biopsies) can be taken or foreign bodies can be removed. A laryngoscopy can be done in the caregiver's office. It may be performed with a hand-held mirror, or it may require the use of a fiberoptic scope. Under some circumstances, it may be done in a hospital with medicine to help you sleep (general anesthesia). LET YOUR CAREGIVER KNOW ABOUT:   Allergies.  Medicines taken, including herbs, eyedrops, over-the-counter medicines, and creams.  Use of steroids (by mouth or creams).  Previous problems with anesthetics or numbing medicines.  History of bleeding or blood problems.  History of blood clots.  Possibility of pregnancy, if this applies.  Previous surgery.  Other health problems. RISKS AND COMPLICATIONS   Pain.  Gagging.  Vomiting.  Swelling.  Bleeding.  Problems from anesthesia. BEFORE THE PROCEDURE  Several days before the procedure, you  may have blood tests to make sure the blood clots normally. You may be asked to stop taking blood thinners, aspirin, and/or nonsteroidal anti-inflammatory drugs (NSAIDs) before the procedure. Do not give aspirin to children. If general anesthesia is going to be used, you will usually be asked to stop eating and drinking at least 8 hours before the procedure. Have someone go with you to the procedure in order to drive you home afterward. PROCEDURE  If you are having the procedure in the caregiver's office, it is usually performed while sitting up in a special exam chair with a headrest. A numbing medicine will be sprayed into the mouth and on the back of the throat. Your caregiver will use a gauze pad to hold the tongue out of the way. A mirror will be held at the back of the throat to allow your caregiver to see down the throat. You may be asked to make certain sounds so that vocal cord movement can be observed. If a fiberoptic scope is being used, it will be inserted into either the nose or the mouth and slipped into the throat. Your caregiver can look through an eyepiece or can see an image projected on a monitor. Pieces of tissue can be taken (biopsies) or foreign bodies removed. If you need to have the procedure performed under general anesthesia, you will be lying down on a special operating table. The same kinds of procedures will be followed, but you will not be aware of them. AFTER THE PROCEDURE   When laryngoscopy is done with only  local numbing, it usually does not require any changes to your activity level after the procedure is complete.  You may have a sore throat.  You may be asked to rest the voice for some length of time after the procedure.  Do not smoke.  Follow your caregiver's directions regarding eating and drinking after the procedure.  If you had a biopsy taken, your caregiver may advise trying to avoid coughing, whispering, or clearing the throat.  If you were given a  general anesthetic or a medicine to help you relax (sedative), you will be sleepy.  There may be some pain or you may feel sick to your stomach (nauseous), but this can usually be controlled with medicines taken by mouth.  You will stay in the recovery room until awake and able to drink fluids.  You can go back to his or her usual level of activity within several days. HOME CARE INSTRUCTIONS   Take all medicines exactly as directed.  Follow any prescribed diet.  Follow instructions regarding rest (including voice rest) and physical activity.  Follow instructions for the use of throat lozenges or gargles. SEEK IMMEDIATE MEDICAL CARE IF:   You have severe pain.  You have new bleeding during coughing, spitting, or vomiting.  You develop nausea and vomiting.  You develop new problems with swallowing.  You have difficulty breathing or have shortness of breath.  You have chest pain.  Your voice changes.  You have a fever.  You develop a cough. MAKE SURE YOU:   Understand these instructions.  Will watch your condition.  Will get help right away if you are not doing well or get worse. Document Released: 12/13/2009 Document Revised: 02/02/2014 Document Reviewed: 12/13/2009 [facility] Patient Information 2015 [place], [place]. This information is not intended to replace advice given to you by your health care provider. Make sure you discuss any questions you have with your health care provider.      Please contact your physician with any problems or Same Day Surgery at [phone], Monday through Friday 6 am to 4 pm, or [place] [facility] number at [phone].

## 2015-06-03 NOTE — Transfer of Care (Signed)
Immediate Anesthesia Transfer of Care Note  Patient: Shelly Dean  Procedure(s) Performed: Procedure(s): LARYNGOSCOPY (N/A)  Patient Location: PACU  Anesthesia Type:General  Level of Consciousness: awake, alert  and oriented  Airway & Oxygen Therapy: Patient Spontanous Breathing and Patient connected to face mask oxygen  Post-op Assessment: Report given to RN and Post -op Vital signs reviewed and stable  Post vital signs: Reviewed and stable  Last Vitals:  Filed Vitals:   06/03/15 1028  BP: 126/85  Pulse: 73  Temp:   Resp: 13    Complications: No apparent anesthesia complications

## 2015-06-03 NOTE — Op Note (Signed)
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  At an appropriate level, the table was turned 90 degrees away from Anesthesia.  A clean preparation and draping was performed in the standard fashion.  A wettend gauze was placed on the patient's maxillary gums.   Using the Dedo laryngoscope, the oropharynx, pharynx, hypopharynx, and larynx was visualized.  The post-cricoid region was evaluated and noted to be normal as well as the cervical esophagus.  The larynx revealed an exophytic mass extending from the false vocal fold down to the left true vocal fold and extending anteriorly across midline to the right false vocal fold and right cord.  The right cord had some mild reinkes edema posteriorly.  Both cords were mobile.  Multiple biopsies were taken of the left false vocal cord mass and this was debulked.  Care was taken to avoid damage to the anterior commisure.  Photo documentation was obtained.  The oropharynx, oral cavity, nasopharynx and hypopharynx were palpated with findings as described above.  The neck was palpated on both sides with the findings as described above.  At this point the procedure was completed and the larynx was sprayed with 4% Lidocaine  The patient was returned to Anesthesia, awakened, extubated, and transferred to PACU in satisfactory condition.   Dispo:   To PACU in good condition.  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  Steroids and  pain control.  Abdurrahman Petersheim

## 2015-06-03 NOTE — H&P (Signed)
..  History and Physical paper copy reviewed and updated date of procedure and will be scanned into system.  

## 2015-06-03 NOTE — Anesthesia Postprocedure Evaluation (Signed)
  Anesthesia Post-op Note  Patient: Shelly Dean  Procedure(s) Performed: Procedure(s): LARYNGOSCOPY (N/A)  Anesthesia type:General  Patient location: PACU  Post pain: Pain level controlled  Post assessment: Post-op Vital signs reviewed, Patient's Cardiovascular Status Stable, Respiratory Function Stable, Patent Airway and No signs of Nausea or vomiting  Post vital signs: Reviewed and stable  Last Vitals:  Filed Vitals:   06/03/15 1141  BP: 112/69  Pulse: 64  Temp:   Resp: 14    Level of consciousness: awake, alert  and patient cooperative  Complications: No apparent anesthesia complications

## 2015-06-03 NOTE — Anesthesia Preprocedure Evaluation (Signed)
Anesthesia Evaluation  Patient identified by MRN, date of birth, ID band Patient awake    Reviewed: Allergy & Precautions, H&P , NPO status , Patient's Chart, lab work & pertinent test results, reviewed documented beta blocker date and time   History of Anesthesia Complications Negative for: history of anesthetic complications  Airway Mallampati: II  TM Distance: >3 FB Neck ROM: full    Dental no notable dental hx. (+) Edentulous Upper, Upper Dentures   Pulmonary neg shortness of breath, neg sleep apnea, neg COPDRecent URI , Current Smoker,  breath sounds clear to auscultation  Pulmonary exam normal       Cardiovascular Exercise Tolerance: Good negative cardio ROS Normal cardiovascular examRhythm:regular Rate:Normal     Neuro/Psych negative neurological ROS  negative psych ROS   GI/Hepatic negative GI ROS, Neg liver ROS,   Endo/Other  negative endocrine ROS  Renal/GU negative Renal ROS  negative genitourinary   Musculoskeletal   Abdominal   Peds  Hematology negative hematology ROS (+)   Anesthesia Other Findings Past Medical History:   Hoarseness                                                   Reproductive/Obstetrics negative OB ROS                             Anesthesia Physical Anesthesia Plan  ASA: II  Anesthesia Plan: General   Post-op Pain Management:    Induction:   Airway Management Planned:   Additional Equipment:   Intra-op Plan:   Post-operative Plan:   Informed Consent: I have reviewed the patients History and Physical, chart, labs and discussed the procedure including the risks, benefits and alternatives for the proposed anesthesia with the patient or authorized representative who has indicated his/her understanding and acceptance.   Dental Advisory Given  Plan Discussed with: Anesthesiologist, CRNA and Surgeon  Anesthesia Plan Comments:          Anesthesia Quick Evaluation

## 2015-06-04 LAB — SURGICAL PATHOLOGY

## 2015-06-09 ENCOUNTER — Other Ambulatory Visit: Payer: Self-pay | Admitting: Otolaryngology

## 2015-06-09 DIAGNOSIS — C329 Malignant neoplasm of larynx, unspecified: Secondary | ICD-10-CM

## 2015-06-14 ENCOUNTER — Ambulatory Visit: Payer: MEDICAID

## 2015-06-17 ENCOUNTER — Ambulatory Visit: Payer: Self-pay | Admitting: Radiation Oncology

## 2015-06-21 ENCOUNTER — Ambulatory Visit: Payer: Self-pay | Admitting: Radiation Oncology

## 2015-06-23 ENCOUNTER — Ambulatory Visit: Admission: RE | Admit: 2015-06-23 | Payer: Self-pay | Source: Ambulatory Visit

## 2015-06-28 ENCOUNTER — Encounter
Admission: RE | Admit: 2015-06-28 | Discharge: 2015-06-28 | Disposition: A | Discharge status: Home / Self Care | Payer: Medicaid Other | Source: Ambulatory Visit | Attending: Otolaryngology | Admitting: Otolaryngology

## 2015-06-28 DIAGNOSIS — C329 Malignant neoplasm of larynx, unspecified: Secondary | ICD-10-CM | POA: Diagnosis present

## 2015-06-28 LAB — GLUCOSE, CAPILLARY: Glucose-Capillary: 93 mg/dL (ref 65–99)

## 2015-06-28 MED ORDER — FLUDEOXYGLUCOSE F - 18 (FDG) INJECTION
12.8600 | Freq: Once | INTRAVENOUS | Status: DC | PRN
  Administered 2015-06-28: 12.86 via INTRAVENOUS
  Filled 2015-06-28: qty 12.86

## 2017-04-26 ENCOUNTER — Other Ambulatory Visit: Payer: Self-pay | Admitting: Nurse Practitioner

## 2017-04-26 DIAGNOSIS — Z1231 Encounter for screening mammogram for malignant neoplasm of breast: Secondary | ICD-10-CM

## 2017-06-26 ENCOUNTER — Encounter (INDEPENDENT_AMBULATORY_CARE_PROVIDER_SITE_OTHER): Payer: Self-pay | Admitting: Vascular Surgery

## 2021-11-02 DEATH — deceased
